# Patient Record
Sex: Female | Born: 1935 | Race: Black or African American | Hispanic: No | Smoking: Never smoker
Health system: Southern US, Community
[De-identification: ages and names within clinical notes are randomized; demographics above are authoritative.]

## PROBLEM LIST (undated history)

## (undated) DIAGNOSIS — T7840XA Allergy, unspecified, initial encounter: Secondary | ICD-10-CM

## (undated) DIAGNOSIS — G5700 Lesion of sciatic nerve, unspecified lower limb: Secondary | ICD-10-CM

## (undated) DIAGNOSIS — M21379 Foot drop, unspecified foot: Secondary | ICD-10-CM

## (undated) DIAGNOSIS — E785 Hyperlipidemia, unspecified: Secondary | ICD-10-CM

## (undated) DIAGNOSIS — I2699 Other pulmonary embolism without acute cor pulmonale: Secondary | ICD-10-CM

## (undated) DIAGNOSIS — Z5189 Encounter for other specified aftercare: Secondary | ICD-10-CM

## (undated) DIAGNOSIS — M1990 Unspecified osteoarthritis, unspecified site: Secondary | ICD-10-CM

## (undated) DIAGNOSIS — D509 Iron deficiency anemia, unspecified: Secondary | ICD-10-CM

## (undated) DIAGNOSIS — IMO0001 Reserved for inherently not codable concepts without codable children: Secondary | ICD-10-CM

## (undated) DIAGNOSIS — I10 Essential (primary) hypertension: Secondary | ICD-10-CM

## (undated) DIAGNOSIS — I70209 Unspecified atherosclerosis of native arteries of extremities, unspecified extremity: Secondary | ICD-10-CM

## (undated) HISTORY — DX: Other pulmonary embolism without acute cor pulmonale: I26.99

## (undated) HISTORY — PX: TONSILLECTOMY: SUR1361

## (undated) HISTORY — DX: Lesion of sciatic nerve, unspecified lower limb: G57.00

## (undated) HISTORY — DX: Unspecified atherosclerosis of native arteries of extremities, unspecified extremity: I70.209

## (undated) HISTORY — DX: Encounter for other specified aftercare: Z51.89

## (undated) HISTORY — PX: OTHER SURGICAL HISTORY: SHX169

## (undated) HISTORY — DX: Reserved for inherently not codable concepts without codable children: IMO0001

## (undated) HISTORY — DX: Hyperlipidemia, unspecified: E78.5

## (undated) HISTORY — DX: Essential (primary) hypertension: I10

## (undated) HISTORY — DX: Allergy, unspecified, initial encounter: T78.40XA

## (undated) HISTORY — DX: Foot drop, unspecified foot: M21.379

## (undated) HISTORY — DX: Iron deficiency anemia, unspecified: D50.9

## (undated) HISTORY — DX: Unspecified osteoarthritis, unspecified site: M19.90

---

## 1999-05-02 ENCOUNTER — Ambulatory Visit (HOSPITAL_COMMUNITY): Admission: RE | Admit: 1999-05-02 | Discharge: 1999-05-02 | Payer: Self-pay | Admitting: Sports Medicine

## 1999-06-18 ENCOUNTER — Ambulatory Visit (HOSPITAL_COMMUNITY): Admission: RE | Admit: 1999-06-18 | Discharge: 1999-06-18 | Payer: Self-pay | Admitting: Orthopedic Surgery

## 2001-01-29 ENCOUNTER — Emergency Department (HOSPITAL_COMMUNITY): Admission: EM | Admit: 2001-01-29 | Discharge: 2001-01-29 | Payer: Self-pay | Admitting: Emergency Medicine

## 2001-03-23 ENCOUNTER — Ambulatory Visit (HOSPITAL_COMMUNITY): Admission: RE | Admit: 2001-03-23 | Discharge: 2001-03-23 | Payer: Self-pay | Admitting: Orthopedic Surgery

## 2001-08-11 ENCOUNTER — Ambulatory Visit (HOSPITAL_COMMUNITY): Admission: RE | Admit: 2001-08-11 | Discharge: 2001-08-11 | Payer: Self-pay | Admitting: Internal Medicine

## 2003-02-19 DIAGNOSIS — I2699 Other pulmonary embolism without acute cor pulmonale: Secondary | ICD-10-CM

## 2003-02-19 HISTORY — PX: TOTAL HIP ARTHROPLASTY: SHX124

## 2003-02-19 HISTORY — DX: Other pulmonary embolism without acute cor pulmonale: I26.99

## 2003-05-08 ENCOUNTER — Emergency Department (HOSPITAL_COMMUNITY): Admission: EM | Admit: 2003-05-08 | Discharge: 2003-05-08 | Payer: Self-pay | Admitting: Emergency Medicine

## 2003-06-16 ENCOUNTER — Inpatient Hospital Stay (HOSPITAL_COMMUNITY): Admission: RE | Admit: 2003-06-16 | Discharge: 2003-06-24 | Payer: Self-pay | Admitting: Orthopedic Surgery

## 2003-08-20 ENCOUNTER — Inpatient Hospital Stay (HOSPITAL_COMMUNITY): Admission: EM | Admit: 2003-08-20 | Discharge: 2003-08-30 | Payer: Self-pay | Admitting: Emergency Medicine

## 2003-08-30 ENCOUNTER — Inpatient Hospital Stay
Admission: RE | Admit: 2003-08-30 | Discharge: 2003-09-09 | Payer: Self-pay | Admitting: Physical Medicine & Rehabilitation

## 2005-05-17 ENCOUNTER — Encounter: Admission: RE | Admit: 2005-05-17 | Discharge: 2005-05-17 | Payer: Self-pay | Admitting: Family Medicine

## 2007-02-04 ENCOUNTER — Encounter: Admission: RE | Admit: 2007-02-04 | Discharge: 2007-02-04 | Payer: Self-pay | Admitting: Internal Medicine

## 2007-02-19 HISTORY — PX: BREAST BIOPSY: SHX20

## 2007-04-10 ENCOUNTER — Observation Stay (HOSPITAL_COMMUNITY): Admission: RE | Admit: 2007-04-10 | Discharge: 2007-04-10 | Payer: Self-pay | Admitting: Surgery

## 2007-04-10 ENCOUNTER — Encounter (INDEPENDENT_AMBULATORY_CARE_PROVIDER_SITE_OTHER): Payer: Self-pay | Admitting: Surgery

## 2007-04-10 ENCOUNTER — Encounter: Admission: RE | Admit: 2007-04-10 | Discharge: 2007-04-10 | Payer: Self-pay | Admitting: Surgery

## 2010-01-09 ENCOUNTER — Encounter: Admission: RE | Admit: 2010-01-09 | Discharge: 2010-01-09 | Payer: Self-pay | Admitting: Orthopedic Surgery

## 2010-01-22 ENCOUNTER — Encounter: Admission: RE | Admit: 2010-01-22 | Discharge: 2010-01-22 | Payer: Self-pay | Admitting: Family Medicine

## 2010-03-11 ENCOUNTER — Encounter: Payer: Self-pay | Admitting: Family Medicine

## 2010-05-23 ENCOUNTER — Emergency Department (HOSPITAL_COMMUNITY): Payer: Medicare Other

## 2010-05-23 ENCOUNTER — Observation Stay (HOSPITAL_COMMUNITY)
Admission: EM | Admit: 2010-05-23 | Discharge: 2010-05-25 | DRG: 203 | Disposition: A | Discharge status: Home / Self Care | Payer: Medicare Other | Source: Ambulatory Visit | Attending: Internal Medicine | Admitting: Internal Medicine

## 2010-05-23 DIAGNOSIS — Z86711 Personal history of pulmonary embolism: Secondary | ICD-10-CM | POA: Insufficient documentation

## 2010-05-23 DIAGNOSIS — Z79899 Other long term (current) drug therapy: Secondary | ICD-10-CM | POA: Insufficient documentation

## 2010-05-23 DIAGNOSIS — R05 Cough: Secondary | ICD-10-CM | POA: Insufficient documentation

## 2010-05-23 DIAGNOSIS — R0989 Other specified symptoms and signs involving the circulatory and respiratory systems: Principal | ICD-10-CM | POA: Insufficient documentation

## 2010-05-23 DIAGNOSIS — I739 Peripheral vascular disease, unspecified: Secondary | ICD-10-CM | POA: Insufficient documentation

## 2010-05-23 DIAGNOSIS — R059 Cough, unspecified: Secondary | ICD-10-CM | POA: Insufficient documentation

## 2010-05-23 DIAGNOSIS — D649 Anemia, unspecified: Secondary | ICD-10-CM | POA: Insufficient documentation

## 2010-05-23 DIAGNOSIS — R0602 Shortness of breath: Secondary | ICD-10-CM | POA: Insufficient documentation

## 2010-05-23 DIAGNOSIS — R0609 Other forms of dyspnea: Principal | ICD-10-CM | POA: Insufficient documentation

## 2010-05-23 DIAGNOSIS — Z96659 Presence of unspecified artificial knee joint: Secondary | ICD-10-CM | POA: Insufficient documentation

## 2010-05-23 DIAGNOSIS — M1990 Unspecified osteoarthritis, unspecified site: Secondary | ICD-10-CM | POA: Insufficient documentation

## 2010-05-23 DIAGNOSIS — I10 Essential (primary) hypertension: Secondary | ICD-10-CM | POA: Insufficient documentation

## 2010-05-23 LAB — DIFFERENTIAL
Basophils Absolute: 0 10*3/uL (ref 0.0–0.1)
Lymphocytes Relative: 18 % (ref 12–46)
Monocytes Relative: 4 % (ref 3–12)

## 2010-05-23 LAB — POCT I-STAT, CHEM 8
HCT: 40 % (ref 36.0–46.0)
Hemoglobin: 13.6 g/dL (ref 12.0–15.0)
Potassium: 3.4 mEq/L — ABNORMAL LOW (ref 3.5–5.1)
Sodium: 140 mEq/L (ref 135–145)

## 2010-05-23 LAB — PROTIME-INR: Prothrombin Time: 13.5 seconds (ref 11.6–15.2)

## 2010-05-23 LAB — POCT CARDIAC MARKERS
Myoglobin, poc: 154 ng/mL (ref 12–200)
Troponin i, poc: <0.05 ng/mL (ref 0.00–0.09)

## 2010-05-23 LAB — CBC
Hemoglobin: 11.3 g/dL — ABNORMAL LOW (ref 12.0–15.0)
MCHC: 33.1 g/dL (ref 30.0–36.0)
RBC: 4.85 MIL/uL (ref 3.87–5.11)

## 2010-05-24 DIAGNOSIS — I369 Nonrheumatic tricuspid valve disorder, unspecified: Secondary | ICD-10-CM

## 2010-05-24 LAB — CARDIAC PANEL(CRET KIN+CKTOT+MB+TROPI)
CK, MB: 3.1 ng/mL (ref 0.3–4.0)
Relative Index: 1.9 (ref 0.0–2.5)
Relative Index: 2.2 (ref 0.0–2.5)
Total CK: 173 U/L (ref 7–177)
Troponin I: 0.04 ng/mL (ref 0.00–0.06)
Troponin I: 0.02 ng/mL (ref 0.00–0.06)

## 2010-05-24 LAB — TROPONIN I: Troponin I: 0.03 ng/mL (ref 0.00–0.06)

## 2010-05-24 LAB — D-DIMER, QUANTITATIVE: D-Dimer, Quant: 6.61 ug/mL-FEU — ABNORMAL HIGH (ref 0.00–0.48)

## 2010-05-24 LAB — IRON AND TIBC
Iron: 54 ug/dL (ref 42–135)
Saturation Ratios: 20 % (ref 20–55)
TIBC: 266 ug/dL (ref 250–470)
UIBC: 212 ug/dL

## 2010-05-24 LAB — FERRITIN: Ferritin: 295 ng/mL — ABNORMAL HIGH (ref 10–291)

## 2010-05-24 MED ORDER — IOHEXOL 300 MG/ML  SOLN
100.0000 mL | Freq: Once | INTRAMUSCULAR | Status: AC | PRN
  Administered 2010-05-24: 100 mL via INTRAVENOUS

## 2010-05-25 DIAGNOSIS — R0602 Shortness of breath: Secondary | ICD-10-CM

## 2010-05-27 NOTE — H&P (Signed)
NAME:  Joan Waters, Joan Waters NO.:  [id number]  MEDICAL RECORD NO.:  [id number]           PATIENT TYPE:  I  LOCATION:  4706                         FACILITY:  [facility]  PHYSICIAN:  Geoffry Paradise, M.D.  DATE OF BIRTH:  1928/01/19  DATE OF ADMISSION:  05/23/2010 DATE OF DISCHARGE:                             HISTORY & PHYSICAL   CHIEF COMPLAINT:  Dyspnea.  HISTORY OF PRESENT ILLNESS:  Joan Waters is an 77 year old patient of Dr. Jarome Matin who presents with unexplained dyspnea.  She does have a history of essential hypertension and has not been hospitalized since her right total hip replacement in 2005 complicated by multiple pulmonary emboli and a course of inpatient rehab.  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  She normally is very active and does live with her husband who has been recentlyhospitalized and transferred to [facility] for rehab following amputation.  She evidently has had some episodic dyspnea going on for approximately 1 week culminating in severe symptoms last evening to the extent that she presented to urgent medical care for treatment.  She did not contact Dr. Silvano Rusk office and was treated at urgent medical care with bronchodilators and steroids.  She evidently settled down to some extent but represented to the emergency room tonight with escalating dyspnea for further management.  She has had no chest pain nor has she had any cough or sputum production.  She had no prior diagnosis of asthma and she has no cardiac history.  She has had no orthopnea or lower extremity swelling.  She denies any chest pain, sputum or fevers. She is currently back to her baseline following some bronchodilators in the emergency room and we have been called to admit her for unexplained dyspnea, possible COPD having failed outpatient interventions.  PAST  MEDICAL HISTORY:  ALLERGIES:  No known drug allergies.  MEDICATIONS:  Labetalol 200 mg p.o. b.i.d.  MEDICAL ILLNESSES:  Essential hypertension, osteoarthritis, pulmonary emboli in 2005, peripheral vascular disease by history and a sciatic nerve palsy secondary to stretch injury resulting a right lower extremity foot drop and sensory changes.  Surgical illnesses include right total hip replacement in 2005, remote tonsillectomy, breast biopsy in 2009.  FAMILY HISTORY:  Noncontributory.  SOCIAL HISTORY:  The patient is married, is currently alone, is fairly independent in her household.  She has never smoked and currently does not smoke by her report.  PHYSICAL EXAMINATION:  VITAL SIGNS:  Temperature is not recorded on the chart, blood pressure 134/76, pulse 79, respiratory rate currently is 16 unlabored, O2 saturation is 100% 2 L. GENERAL:  She is supine, no distress.  No dyspnea, calm, interactive and appropriate. HEENT:  Murky sclerae, anicteric.  Fundi nonvisualized.  Extraocular movements intact.  Visual fields intact.  Oropharynx benign. NECK:  Supple.  No JVD or bruits. RESPIRATORY:  No accessory muscle utilization.  Lungs are clear to auscultation and percussion.  Normal inspiratory, expiratory phases.  No wheezing, rales or rhonchi. CARDIOVASCULAR:  Regular rate and rhythm, a 2/6 systolic  ejection murmur over the aortic outflow tract.  No S3 or S4.  No rubs. ABDOMEN:  Soft, nontender.  Bowel sounds normal.  No hepatosplenomegaly. BACK:  No CVA or spinal tenderness. EXTREMITIES:  Some venous stasis changes.  Trace edema.  Calves soft and nontender.  Negative Homans sign.  She does have a right lower extremity foot drop with brace intact. NEUROLOGIC:  She is nonlateralized.  Chest x-ray, no acute disease.  EKG reveals a bundle branch block with ST changes.  CBC; hemoglobin 11.3, hematocrit 34.1, white blood count is 5.7, platelet count was 252,000.  Chemistry; sodium  140, potassium 3.4, chloride 103, BUN 20, creatinine 1, glucose 111, CK-MB is 2.7, troponin less than 0.05.  Chest x-ray, no acute cardiopulmonary disease, interstitial coarsening chronic  ASSESSMENT: 1. Dyspnea, etiology not clear.  We will order CT angio, rule out PE,     given her prior history; we will treat empirically for reactive     airway disease/OPD with bronchodilators and steroids and check an     echocardiogram given the cardiac murmur not knowing longevity.  She     certainly is not evaluated in the recent past and claims she has     not seen Dr. Eloise Harman recently as well.  There is no evidence that     this is any type of anginal equivalent or coronary syndrome at this     time by history or exam. 2. Essential hypertension, degree of baseline control not clear,     currently well controlled. 3. History of hyperlipidemia.  PLAN:  As above, empiric treat with bronchodilators and steroids.  CT angio pending and decision regarding anticoagulation based upon this. Echocardiogram ordered as well.  For further see orders.          ______________________________ Geoffry Paradise, M.D.     RA/MEDQ  D:  05/24/2010  T:  05/24/2010  Job:  [id number]  Electronically Signed by Geoffry Paradise M.D. on 05/27/2010 11:40:42 AM

## 2010-06-06 NOTE — H&P (Signed)
NAME:  Joan Waters, Joan Waters NO.:  [id number]  MEDICAL RECORD NO.:  [id number]           PATIENT TYPE:  I  LOCATION:  4706                         FACILITY:  [facility]  PHYSICIAN:  Barry Dienes. Eloise Harman, M.D.DATE OF BIRTH:  1927/06/18  DATE OF ADMISSION:  05/23/2010 DATE OF DISCHARGE:  05/25/2010                             HISTORY & PHYSICAL   PERTINENT FINDINGS:  The patient is an 77 year old woman who presented to the emergency room with unexplained dyspnea.  She has a history of essential hypertension and in 2005 had right total hip replacement complicated by multiple pulmonary emboli.  She also has a right foot drop because of sciatic nerve palsy for which she wears an ankle-foot orthosis.  For about the past week, she has had dyspnea with exertion and was seen at an urgent care clinic and treated with bronchodilators and short course of steroids.  Her symptoms improved initially, then worsened.  She has not had substernal chest pain or productive cough. She has not had orthopnea or lower extremity edema.  She felt better in the emergency room after initial treatment with bronchodilators and corticosteroids.  PAST MEDICAL HISTORY:  Hypertension, osteoarthritis with chronic left hip pain.  In 2005, pulmonary emboli treated with Coumadin anticoagulation, atherosclerotic peripheral vascular disease, right- sided sciatic neuropathy causing right-sided foot drop.  PAST SURGICAL HISTORY:  In 2005, right total hip replacement, remote tonsillectomy.  In 2009, breast biopsy that was benign.  SOCIAL HISTORY:  She is married and currently her husband lives in a skilled nursing facility because of end-stage osteoarthritis.  She has no history of tobacco or alcohol abuse.  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  INITIAL PHYSICAL EXAMINATION:  VITAL SIGNS:  Blood pressure 134/76, pulse 79, respirations 16, pulse oxygen saturation was 100% on 2 L  per minute of nasal cannula oxygen. GENERAL:  She is a well-nourished, well-developed black woman who was in no apparent distress. HEAD, EYES, EARS, NOSE AND THROAT:  Within normal limits. NECK:  Supple without jugular venous distention or carotid bruit. CHEST:  Clear to auscultation with no wheezing or rhonchi. HEART:  Had a regular rate and rhythm with a grade 2/6 systolic ejection murmur and there was no S3 or rub. ABDOMEN:  Benign. EXTREMITIES:  She had bilateral trace leg edema without a palpable venous cord and she has a right lower extremity foot drop.  INITIAL LABORATORY STUDIES:  EKG showed the following:  Normal sinus rhythm, first-degree AV block, nonspecific intraventricular conduction delay, lateral infarct, age indeterminate, abnormal R-wave progression. CBC had white blood cell count 5.7, hemoglobin 11.3, platelets 252. Serum sodium was 140, potassium 3.4, chloride 103, BUN 20, creatinine 1.0, glucose 111, CPK-MB was 2.7.  Troponin I was less than 0.05.  Chest x-ray showed no acute cardiopulmonary disease with chronic interstitial coarsening.  HOSPITAL COURSE:  The patient was admitted to a medical bed with telemetry.  She had serial cardiac isoenzymes drawn that were within normal limits.  She also had a CT angiography of the chest with IV contrast done that showed no abnormal filling defects within the main pulmonary arteries  or its branches to suggest a pulmonary embolus. There was respiratory motion artifact that obscured the lower lobe pulmonary arteries.  There was a small nodule on the right middle lobe measuring 4.6 mm.  There was no airspace consolidation identified. There is a focal area of bronchial occlusion within the right lower lobe that likely represented a mucus plug.  Limited imaging to the upper abdomen showed a large cyst arising from the upper pole of the left kidney that measured at least 9.4 cm and a small cyst was seen in the upper pole of the  right kidney.  It was recommended that she have a reevaluation of the right middle lobe nodule with a followup CT scan in 6-12 months.  She also had a left lower extremity DVT ultrasound exam that showed no obvious evidence of DVT, superficial thrombus, or Baker cyst.  She had an echocardiogram done to evaluate her heart murmur. The echocardiogram showed left ventricular ejection fraction of 55-60% with a normal size left ventricular cavity.  The wall thickness was increased in the pattern of moderate left ventricular hypertrophy.  There was abnormal septal wall motion.  The aortic valve leaflets were mildly calcified and there was no evidence of significant aortic valve stenosis.  There was trivial mitral regurgitation and mild tricuspid regurgitation.  The right ventricular systolic function was normal.  She also had laboratory studies that also included a serum iron of 54 with total iron binding capacity of 266 (20% saturation) and serum ferritin 295.  A D-dimer level was 6.41.  CONDITION ON DISCHARGE:  Her breathing was at her baseline.  She was eating and drinking well and not having any dyspnea with ambulating in the room.  Most recent vital signs include blood pressure 176/69, pulse 72, respirations 20, temperature 98.1, pulse oxygen saturation was 96% on room air.  In general, she is an elderly black woman who is in no apparent distress.  Chest as clear to auscultation.  Heart had a regular rate and rhythm with a systolic ejection murmur grade 1/6.  Abdomen was benign.  Extremities were without edema.  Most recent laboratory studies include the following:  White blood cell count 5.7, hemoglobin 11.3, hematocrit 34.1, platelets 252 with MCV 70 and RDW 14.8, serum sodium 140, potassium 3.4, BUN 20, creatinine 1.0.  DISCHARGE DIAGNOSES: 1. Dyspnea, possibly secondary to asthma given mucous plug seen on     chest CT scan. 2. Hypertension, essential. 3. Left ventricular  hypertrophy. 4. Nonspecific intraventricular conduction delay. 5. Microcytic anemia. 6. Hypokalemia. 7. Elevated D-dimer test without evidence of pulmonary embolism or     DVT. 8. Hyperlipidemia.  DISCHARGE MEDICATIONS: 1. Guaifenesin 600 mg p.o. twice daily. 2. Prednisone 10 mg to be taken as 4 tablets daily for 2 days, then 3     tablets daily for 2 days, then 2 tablets daily for 3 days, then 1     tablet daily for 5 days. 3. Proventil HFA take 2 inhalations by mouth 4 times daily as needed     for difficulty breathing or wheezing. 4. Potassium chloride 20 mEq p.o. daily. 5. Iron sulfate 325 mg p.o. daily. 6. Labetalol 200 mg p.o. twice daily. 7. Lovastatin 40 mg p.o. daily. 8. Maxzide 25 1 tablet p.o. daily. 9. Norvasc 5 mg p.o. daily.  DISPOSITION AND FOLLOWUP:  The patient should call [facility] at [phone] to schedule a followup evaluation within 1-2 weeks following discharge.  She should call our office as soon as possible  should her symptoms worsen in the interim.  The process of discharge took 40 minutes.          ______________________________ Barry Dienes Eloise Harman, M.D.     DGP/MEDQ  D:  05/25/2010  T:  05/26/2010  Job:  [id number]  Electronically Signed by Jarome Matin M.D. on 06/06/2010 08:05:34 AM

## 2010-06-14 ENCOUNTER — Ambulatory Visit (INDEPENDENT_AMBULATORY_CARE_PROVIDER_SITE_OTHER): Payer: Medicare Other | Admitting: Gastroenterology

## 2010-06-14 ENCOUNTER — Encounter: Payer: Self-pay | Admitting: Gastroenterology

## 2010-06-14 VITALS — BP 120/62 | HR 84 | Ht 65.0 in | Wt 178.0 lb

## 2010-06-14 DIAGNOSIS — D509 Iron deficiency anemia, unspecified: Secondary | ICD-10-CM

## 2010-06-14 MED ORDER — PEG-KCL-NACL-NASULF-NA ASC-C 100 G PO SOLR: 1.0000 | Freq: Once | ORAL | Status: AC

## 2010-06-14 NOTE — Progress Notes (Signed)
History of Present Illness: This is an 77 year old female with a history of recurrent iron deficiency anemia. She states she has been treated with iron several times in the past. She was recently hospitalized for dyspnea felt secondary to bronchospasm. I have reviewed the hospital discharge summary, bloodwork and imaging study reports from that hospitalization. No significant cardiopulmonary disease was uncovered. Her hemoglobin was 11.3 with an MCV of 70 and an iron saturation of 20%. She has no gastrointestinal complaints and denies change in bowel habits, constipation, diarrhea, change in stool caliber, melena, hematochezia, weight loss, nausea, vomiting, hematemesis, dysphagia.  She is not previously had colonoscopy or upper endoscopy. Past Medical History  Diagnosis Date  . Hypertension   . OA (osteoarthritis)   . Pulmonary embolism 2005  . Atherosclerotic peripheral vascular disease   . Sciatic neuropathy     right sided causing foot drop  . Hyperlipidemia   . Iron deficiency anemia    Past Surgical History  Procedure Date  . Total hip arthroplasty 2005    Right  . Tonsillectomy   . Breast biopsy 2009  . Hysterotomy     reports that she has never smoked. She has never used smokeless tobacco. She reports that she does not drink alcohol or use illicit drugs. family history includes COPD in her mother and Stroke in her father. No Known Allergies Outpatient Encounter Prescriptions as of 06/14/2010  Medication Sig Dispense Refill  . albuterol (PROVENTIL HFA) 108 (90 BASE) MCG/ACT inhaler Inhale 2 puffs into the lungs 4 (four) times daily as needed.        Marland Kitchen amLODipine (NORVASC) 5 MG tablet Take 5 mg by mouth daily.        . ferrous sulfate 325 (65 FE) MG tablet Take 325 mg by mouth daily with breakfast.        . labetalol (NORMODYNE) 200 MG tablet Take 200 mg by mouth 2 (two) times daily.        Marland Kitchen lovastatin (MEVACOR) 40 MG tablet Take 40 mg by mouth at bedtime.        . potassium  chloride SA (K-DUR,KLOR-CON) 20 MEQ tablet Take 20 mEq by mouth daily.        Marland Kitchen triamterene-hydrochlorothiazide (MAXZIDE-25) 37.5-25 MG per tablet Take 1 tablet by mouth daily.        . peg 3350 powder (MOVIPREP) 100 G SOLR Take 1 kit (100 g total) by mouth once.  1 kit  0   Review of Systems: Joints and is joint pain severe left hip pain. Pertinent positive and negative review of systems were noted in the above HPI section. All other review of systems were otherwise negative.  Physical Exam: General: Well developed , well nourished, no acute distress Head: Normocephalic and atraumatic Eyes:  sclerae anicteric, EOMI Ears: Normal auditory acuity Mouth: No deformity or lesions Neck: Supple, no masses or thyromegaly Lungs: Clear throughout to auscultation Heart: Regular rate and rhythm; no murmurs, rubs or bruits Abdomen: Soft, non tender and non distended. No masses, hepatosplenomegaly or hernias noted. Normal Bowel sounds Rectal: Deferred to colonoscopy  Musculoskeletal: Using a walker with severe gait difficulties due to osteoarthritis and severe chronic left hip pain  Skin: No lesions on visible extremities Pulses:  Normal pulses noted Extremities: No clubbing, cyanosis, edema or deformities noted Neurological: Alert oriented x 4, grossly nonfocal Cervical Nodes:  No significant cervical adenopathy Inguinal Nodes: No significant inguinal adenopathy Psychological:  Alert and cooperative. Normal mood and affect  Assessment and Recommendations:  1. Recurrent iron deficiency anemia.  Rule out sources of gastrointestinal blood loss such as AVMs, neoplasms and ulcers. Obtain stool Hemoccults. Continue fe repoacement. The risks, benefits, and alternatives to colonoscopy with possible biopsy and possible polypectomy were discussed with the patient and they consent to proceed. The risks, benefits, and alternatives to endoscopy with possible biopsy and possible dilation were discussed with the  patient and they consent to proceed.   2. Severe osteoarthritis in left hip. She has an orthopedic appointment with Dr. Charlann Boxer on May 26th.

## 2010-06-14 NOTE — Patient Instructions (Signed)
You have been scheduled for a Upper Endoscopy and Colonoscopy, separate instruction sheet given. Follow instructions on Hemoccult cards and mail back to [place] when finished.  Cc: Jarome Matin, MD

## 2010-06-26 ENCOUNTER — Other Ambulatory Visit: Payer: Self-pay | Admitting: Gastroenterology

## 2010-06-26 ENCOUNTER — Ambulatory Visit (AMBULATORY_SURGERY_CENTER): Payer: Medicare Other | Admitting: Gastroenterology

## 2010-06-26 ENCOUNTER — Encounter: Payer: Self-pay | Admitting: Gastroenterology

## 2010-06-26 ENCOUNTER — Other Ambulatory Visit: Payer: Medicare Other

## 2010-06-26 VITALS — BP 129/50 | HR 83 | Temp 97.5°F | Resp 13 | Ht 65.0 in | Wt 172.0 lb

## 2010-06-26 DIAGNOSIS — Z1289 Encounter for screening for malignant neoplasm of other sites: Secondary | ICD-10-CM

## 2010-06-26 DIAGNOSIS — K2940 Chronic atrophic gastritis without bleeding: Secondary | ICD-10-CM

## 2010-06-26 DIAGNOSIS — K2970 Gastritis, unspecified, without bleeding: Secondary | ICD-10-CM

## 2010-06-26 DIAGNOSIS — K5730 Diverticulosis of large intestine without perforation or abscess without bleeding: Secondary | ICD-10-CM

## 2010-06-26 DIAGNOSIS — D649 Anemia, unspecified: Secondary | ICD-10-CM

## 2010-06-26 DIAGNOSIS — K6389 Other specified diseases of intestine: Secondary | ICD-10-CM

## 2010-06-26 DIAGNOSIS — D509 Iron deficiency anemia, unspecified: Secondary | ICD-10-CM

## 2010-06-26 LAB — HEMOCCULT SLIDES (X 3 CARDS)
Fecal Occult Blood: NEGATIVE
OCCULT 1: NEGATIVE
OCCULT 2: NEGATIVE
OCCULT 3: NEGATIVE
OCCULT 4: NEGATIVE
OCCULT 5: NEGATIVE

## 2010-06-26 MED ORDER — SODIUM CHLORIDE 0.9 % IV SOLN: 500.0000 mL | INTRAVENOUS | Status: AC

## 2010-06-26 NOTE — Patient Instructions (Signed)
Refer to blue and green discharge sheets.

## 2010-06-27 ENCOUNTER — Telehealth: Payer: Self-pay | Admitting: *Deleted

## 2010-06-27 NOTE — Telephone Encounter (Signed)
No answer at number and no message left due to no identification on machine

## 2010-07-02 ENCOUNTER — Encounter: Payer: Self-pay | Admitting: Gastroenterology

## 2010-07-03 NOTE — Op Note (Signed)
NAME:  PHEONIX, WISBY NO.:  [id number]   MEDICAL RECORD NO.:  [id number]          PATIENT TYPE:  OBV   LOCATION:  2899                         FACILITY:  [facility]   PHYSICIAN:  Clovis Pu. Cornett, M.D.DATE OF BIRTH:  August 18, 1927   DATE OF PROCEDURE:  04/10/2007  DATE OF DISCHARGE:                               OPERATIVE REPORT   PREOPERATIVE DIAGNOSIS:  Left breast mass.   POSTOPERATIVE DIAGNOSIS:  Left breast mass.   PROCEDURE:  Left breast needle localized excisional biopsy.   SURGEON:  Maisie Fus A. Cornett, M.D.   ANESTHESIA:  MAC with 0.25% Sensorcaine local.   ESTIMATED BLOOD LOSS:  20 mL.   SPECIMEN:  Left breast tissue with clip verified by radiographs to be  adequate.   DRAINS:  None.   INDICATIONS FOR PROCEDURE:  The patient is a 77 year old female with a  sclerosing left breast found by core biopsy.  Excisional biopsy is  recommended for further evaluation.  She presents today for that.   DESCRIPTION OF PROCEDURE:  After undergoing left breast needle  localization, the patient was brought to the operating suite.  After  induction of MAC anesthesia, the left breast was prepped and draped in a  sterile fashion.  The wire was trimmed.  A curvilinear incision was made  at about 12 o'clock where the wire entered the breast.  Tissue flaps  were raised slightly.  Unfortunately, during manipulation of the mass,  the localizing wire came out.  The entire wire, though, was removed in  its entirety upon examination.  Fortunately, I could feel the mass and  was able to excise it without the wire.  Specimen radiograph revealed  the biopsy clip to be within the mass.  Irrigation was used and  hemostasis was achieved.  The wound was closed in layers with a deep  layer of 3-0 Vicryl and a 4-0 Monocryl subcuticular stitch.  Dermabond  was applied to the skin.  All final counts of sponge, needle and  instruments were found to be correct this portion of the case.   The  patient was then awakened and taken to recovery in satisfactory  conditions.  All final counts were found to be correct x2.      Thomas A. Cornett, M.D.  Electronically Signed    TAC/MEDQ  D:  04/10/2007  T:  04/11/2007  Job:  [id number]   cc:   Barry Dienes. Eloise Harman, M.D.

## 2010-07-06 NOTE — Discharge Summary (Signed)
NAME:  Joan Waters, Joan Waters                      ACCOUNT NO.:  [id number]   MEDICAL RECORD NO.:  [id number]                   PATIENT TYPE:  INP   LOCATION:  5019                                 FACILITY:  [facility]   PHYSICIAN:  Myrtie Neither, M.D.                 DATE OF BIRTH:  04/17/1927   DATE OF ADMISSION:  06/16/2003  DATE OF DISCHARGE:  06/24/2003                                 DISCHARGE SUMMARY   ADMISSION DIAGNOSIS:  Severe degenerative joint disease right hip.   DISCHARGE DIAGNOSES:  1. Severe degenerative joint disease right hip.  2. Sciatic nerve palsy, stretch injury.   INFECTIONS:  None.   PAST SURGICAL HISTORY:  Right total hip arthroplasty done on June 16, 2003.   HISTORY OF PRESENT ILLNESS:  This is a 77 year old female who has been  followed for severe degenerative joint disease involving her right hip.  The  patient had been treated with anti-inflammatories, pain medications, and use  of cane for ambulation with progressive worsening of pain on rest as well as  ambulation.   PHYSICAL EXAMINATION:  EXTREMITIES:  Pertinent physical was that of the  right hip.  The patient had an antalgic gait on the right side.  Right hip  had limited range of motion on rotation with severe pain on rotation as well  as attempted flexion.  NEUROLOGICAL:  Intact.   LABORATORY DATA:  X-rays in the office demonstrated a sclerotic degenerative  joint changes involving the femoral head and acetabulum.   HOSPITAL COURSE:  The patient had preoperative medical evaluation by primary  care physician and was found to be stable enough to undergo surgery.  The  patient underwent CBC, CMET, EKG, chest x-ray, urinalysis, PT, and PTT.  The  patient's laboratory was stable enough for the patient to undergo surgery.   The patient underwent right total hip arthroplasty on June 16, 2003.  Postoperatively, the patient was found to have stretch nerve injury with  drop foot on the right side.  The  patient was fitted for a drop foot brace  orthotic and was continued on physical therapy and progressed fairly well  with it.   The patient was partial weightbearing on the right side with Coumadin  prophylaxis.  The patient progressed well enough to be discharged to home  with home health therapy arranged.   DISCHARGE MEDICATIONS:  1. The patient was continued on Coumadin therapy.  2. Percocet 1-2 p.o. q.4h. p.r.n. pain.   CONDITION ON DISCHARGE:  The patient was discharged in stable and  satisfactory condition.                                                Myrtie Neither, M.D.    AC/MEDQ  D:  08/02/2003  T:  08/03/2003  Job:  [id number] 

## 2010-07-06 NOTE — Op Note (Signed)
NAME:  CATHLEEN, YAGI                      ACCOUNT NO.:  [id number]   MEDICAL RECORD NO.:  [id number]                   PATIENT TYPE:  INP   LOCATION:  2550                                 FACILITY:  [facility]   PHYSICIAN:  Myrtie Neither, M.D.                 DATE OF BIRTH:  [birth year]   DATE OF PROCEDURE:  06/16/2003  DATE OF DISCHARGE:                                 OPERATIVE REPORT   PREOPERATIVE DIAGNOSIS:  Severe degenerative joint disease, right hip.   POSTOPERATIVE DIAGNOSIS:  Severe degenerative joint disease, right hip.   ANESTHESIA:  General.   PROCEDURE:  Right total hip arthroplasty.   PROCEDURE IN DETAIL:  The patient was taken to the operating room after  given adequate preoperative medications, given general anesthesia and  intubated. The patient was placed in the right lateral position. Right hip  was prepped with DuraPrep and draped in a sterile manner.   A posterior saddle __________  made over the right hip, going through the  skin and subcutaneous tissue down to the fascial lata. Sharp rotators were  identified and released. Capsules were identified and incised, partially  resected. The hip was then dislocated, severe degenerative joint changes  were noted over the head as well as neck and acetabulum. Femoral neck  cutting guide was put in place and appropriate cut was made. Next, exposure  of the acetabulum was done with resection of the capsule. Reaming of the  acetabulum was done and started out with 48 mm and reaming of the acetabulum  was done all the way up to 51 mm. Good bleeding surface was obtained.  Osteophytes were removed from the area. Trial implant was put in place and  was found to be a very good snug fit.   Next, attention was turned to the femoral neck. Cookie cutter was used to  initiate cutting into the canal. Canal finder was then used. Reaming of the  femoral canal was done up to 13.5 mm with good bony surface of the femoral  canal.  Marney Doctor was put in place and trial reduction of the hip was done. Hip  was too tight. Further reaming down the femoral canal was done, also with  removal of more of the femoral neck. This allowed good reduction of the hip,  full range of motion, good stability, no telescoping, good leg length.  Permanent components were then put in place using three-hole acetabular  liner, 52 mm, for a 32 mm head, acetabular cup 10-degree, 32 mm +1 femoral  head, Press-Fit, small statue from the stem, 13.5 mm. Two cancellous screws  were placed into the acetabulum for further stability. Hole eliminator was  used with all components put in place. Again full range of motion, good  stability, no telescoping and good leg length. Copious irrigation was then  done followed by closure of the fascia with 0 Vicryl, 2-0 for the  subcutaneous and skin staples  for the skin. Compressive dressing was  applied. The patient was placed in the hip abduction pillow.   The patient tolerated the procedure quite well and went to the recovery room  in stable and satisfactory condition.                                               Myrtie Neither, M.D.    AC/MEDQ  D:  06/16/2003  T:  06/16/2003  Job:  [id number]

## 2010-07-06 NOTE — H&P (Signed)
NAME:  Joan Waters, Joan Waters                      ACCOUNT NO.:  [id number]   MEDICAL RECORD NO.:  [id number]                   PATIENT TYPE:  INP   LOCATION:  2011                                 FACILITY:  [facility]   PHYSICIAN:  Mark A. Perini, M.D.                DATE OF BIRTH:  [birth year]   DATE OF ADMISSION:  08/20/2003  DATE OF DISCHARGE:                                HISTORY & PHYSICAL   CHIEF COMPLAINT:  Shortness of breath.   HISTORY OF PRESENT ILLNESS:  Ms. Purdum is a 77 year old female who had a  recent right hip replacement.  This has been complicated by sciatic nerve  palsy due to a stretch injury and some persistent edema of her right lower  extremity.  She did take postoperative Coumadin up until about one month  ago.  She has been walking with a walker.  She felt somewhat poorly earlier  this week and has not been moving around quite as much.  She presented to  the ER today with three day history of shortness of breath, particularly  worse over the last day.  In the emergency room, CT scan was performed and  showed extensive bilateral pulmonary emboli.  She will require admission.   PAST MEDICAL HISTORY:  1. Congestive heart failure is listed in the chart, but the patient denies     this.  2. Hypertension.  3. April 2005, she had a right hip replacement.  4. Degenerative joint disease.  5. 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     She has numbness of her right foot and weakness of her right foot.  6. Atherosclerotic peripheral vascular disease is also listed in the chart,     but she cannot corroborate this.  7. She has no history of coronary disease or stroke, per her history.  8. She has a history of tonsillectomy, but no other surgical history.   ALLERGIES:  None.   MEDICATIONS:  1. Lasix 40 mg daily.  2. Labetalol 200 mg twice a day.  3. She has been off of Coumadin for four weeks.   SOCIAL HISTORY:  No alcohol, no  tobacco, no drug use.  She is married to her  husband, Ivar Drape, since 54.  He is at her bedside.  She has one daughter,  three grandchildren and two great-grandchildren.   FAMILY HISTORY:  Noncontributory.   REVIEW OF SYSTEMS:  She has three days of shortness of breath, worse at  night.  She has been having sweats every night and possibly some subjective  temperatures.  She has had no new swelling, but her right lower extremity  has stayed swollen since her surgery.  She denies any chest pain.  She has  had some cough, especially the last three or four days, but this is not  productive and there is no hematemesis or hemoptysis.  She has felt bad all  week.  She says that her bowel movements have been normal.  She has had no  blood from above or below.  Normal genitourinary function.  She has maybe  had a little bit of weight loss recently.  She does not get regular  mammograms, but she does have Dr. Eloise Harman check her periodically.  She has  still been doing physical therapy at home.   PHYSICAL EXAMINATION:  VITAL SIGNS:  94% saturation on room air; blood  pressure 128/42, although her systolic pressure was as low as the 80s on  admission; pulse 81; respiratory rate 33; weight 186 pounds; temperature was  97 on admission and then went up to 101.  GENERAL:  She is in no acute distress.  She is in good spirits.  LUNGS:  Clear to auscultation bilaterally with no wheezes, rales or rhonchi.  HEART:  Regular rate and rhythm with no murmur.  ABDOMEN:  Soft.  EXTREMITIES:  There is bilateral lower extremity edema, right greater than  left; the right is 2+ and the left is 1+.  Her right leg is somewhat warm to  touch.  There is no definite erythema.  NEUROLOGIC:  She is intact.  BREASTS:  Bilateral clinical breast exam is done and is normal.   LABORATORY DATA:  Chest x-ray personally reviewed and showed some left base  atelectasis and a small effusion; otherwise normal.  CT scan of the  chest  personally reviewed and showed extensive bilateral pulmonary emboli, some  left base atelectasis and a small effusion, pleural effusion, at the left  base.  She also has a left renal cyst approximately 7.5 cm in maximum  dimension that appears to be a simple cyst.  EKG shows normal sinus rhythm  and premature atrial contractions, left anterior fascicular block and left  axis deviation, biatrial enlargement, but no change to her April 2005 EKG.  There is some nonspecific ST and T wave flattening inferiorly, but this is  unchanged compared to two months ago.  Sodium is 139, potassium 3.5,  chloride 105, CO2 23, BUN 12, creatinine 1.0, glucose 117, pH 7.43, PCO2 34,  bicarb 22.4.  D-Dimer is elevated at 12.9.  Blood cultures times two have  been sent.  CK MB is 1.8, troponin I is somewhat elevated at 0.5.  The first  set of CK MB was 3.0 and troponin I was 0.6.  Myoglobin has been 127 and  108.  White count is 6.6 with 76% segs, 14% lymphocytes, 7% monocytes.  Hemoglobin is somewhat low at 9.5 with an MCV of 71.  Platelet count is  341,000.  UA is negative, except for moderate leukocytes.  The microscopic  shows 3:6 white cells, a few squamous cells and a few bacteria.   ASSESSMENT AND PLAN:  This is a 77 year old female with bilateral pulmonary  emboli.  We will admit.  We will treat with Lovenox therapy.  We will  initiate Coumadin therapy.  She will be on bed rest for the first 24 to 48  hours.  We will continue labetalol.  We will place her on a cardiac diet.  We will decrease her Lasix to 20 mg daily and add potassium.  We will  hydrate gently with 50 cc an hour of normal saline.  We will add a proton  pump inhibitor for GI protection.  We will heme check stools and check iron  studies given her apparent iron deficiency anemia.  The patient is a full  code status and  her prognosis is guarded.                                               Mark A. Waynard Edwards, M.D.    MAP/MEDQ  D:   08/20/2003  T:  08/22/2003  Job:  [id number]   cc:   Barry Dienes. Eloise Harman, M.D.  [address]  [place]  [place] [address]  Fax: [phone]   Montez Morita, Dr.

## 2010-07-06 NOTE — Consult Note (Signed)
NAME:  Joan Waters, Joan Waters                      ACCOUNT NO.:  [id number]   MEDICAL RECORD NO.:  [id number]                   PATIENT TYPE:  INP   LOCATION:  2011                                 FACILITY:  [facility]   PHYSICIAN:  John C. Madilyn Fireman, M.D.                 DATE OF BIRTH:  Jun 18, 1927   DATE OF CONSULTATION:  08/22/2003  DATE OF DISCHARGE:                                   CONSULTATION   HISTORY OF PRESENT ILLNESS:  The patient is a 78 year old white female who  presents with extensive bilateral  pulmonary emboli after a recent right hip  replacement.  She has also been found to be anemic with hemoglobin of 8.8,  MCV 71, and iron saturation of 75.  She states she has had iron deficiency  in the past that was corrected by oral iron.  She denies any history of GI  bleeding, any blood in her stool, any GI symptoms whatsoever, or any family  history of GI malignancy.  She has never had peptic ulcer disease, and she  has never had any prior colon screening.  She had one stool this admission  which was heme negative.  She was started on Lovenox with Coumadin last  night and has been given supplemental oxygen.  Her respiratory status is  improved today compared to yesterday.   PAST MEDICAL HISTORY:  1. History of congestive heart failure.  2. Hypertension.  3. Degenerative joint disease.  4. Atherosclerotic peripheral vascular disease.   PAST SURGICAL HISTORY:  Tonsillectomy and right hip replacement.   ALLERGIES:  None.   MEDICATIONS:  Lasix, labetalol.   FAMILY HISTORY:  Negative for GI malignancy.   SOCIAL HISTORY:  The patient denies alcohol or tobacco use.  She is married.   PHYSICAL EXAMINATION:  GENERAL:  Well-developed, well-nourished white female  in no acute distress.  HEART:  Regular rate and rhythm without murmur.  LUNGS: Clear.  ABDOMEN: Soft, nondistended.  Normoactive bowel sounds.  No  hepatosplenomegaly, mass, or guarding.   IMPRESSION:  1. Iron-deficiency  anemia with one heme-negative stool.  No gastrointestinal     symptoms.  No risk factors for gastrointestinal malignancy.  2. Acute pulmonary embolism requiring anticoagulation.   PLAN:  Given recent respiratory compromise and concerns about sedation as  well as her heme-negative stool portending a relatively low risk of colonic  neoplasia and GI bleeding responsible for her anemia, will limit initial  workup to barium enema while remaining on current anticoagulation.  Further  recommendations to follow.                                               John C. Madilyn Fireman, M.D.    JCH/MEDQ  D:  08/22/2003  T:  08/22/2003  Job:  [id number]   cc:  Mark A. Waynard Edwards, M.D.  [address]  [place]  [place] [address]  Fax: [phone]

## 2010-07-06 NOTE — Discharge Summary (Signed)
NAME:  Joan Waters, Joan Waters                      ACCOUNT NO.:  [id number]   MEDICAL RECORD NO.:  [id number]                   PATIENT TYPE:  INP   LOCATION:  2011                                 FACILITY:  [facility]   PHYSICIAN:  Barry Dienes. Eloise Harman, M.D.            DATE OF BIRTH:  1927/09/22   DATE OF ADMISSION:  08/20/2003  DATE OF DISCHARGE:                           DISCHARGE SUMMARY - REFERRING   HISTORY OF PRESENT ILLNESS:  The patient is a 77 year old black female with  several medical problems.  She had recently had right total hip replacement  for end-stage osteoarthritis.  Since that time, she has complained of  difficulty ambulating due to a right foot drop with progressive fatigue and  dyspnea.  She presented to the emergency room for evaluation and had a CT  scan of the chest showing extensive bilateral pulmonary emboli.  She was  admitted for further evaluation.   PAST MEDICAL HISTORY:  1. Vague history of congestive heart failure with no history of coronary     artery disease.  2. Hypertension.  3. April 2005, right total hip replacement.  4. Osteoarthritis of the left hip.  5. Right foot drop after the total hip replacement.  6. Possible history of atherosclerotic peripheral vascular disease.  7. History of tonsillectomy.   ALLERGIES:  No known drug allergies.   MEDICATIONS PRIOR TO ADMISSION:  1. Lasix 40 mg p.o. daily.  2. Labetalol 200 mg p.o. b.i.d.  3. She had stopped Coumadin for the past four weeks.   INITIAL PHYSICAL EXAMINATION:  GENERAL APPEARANCE:  She is a well-developed,  well-nourished black female who was in no apparent distress on nasal cannula  oxygen.  VITAL SIGNS:  Pulse oxygen saturation 94% on room air.  Blood pressure  128/92, pulse 81, temperature 101, respiratory rate 33, weight 186 pounds.  CHEST:  Clear to auscultation.  CARDIOVASCULAR:  Regular rate and rhythm.  ABDOMEN:  Normal bowel sounds with no hepatosplenomegaly or tenderness.  EXTREMITIES:  Left 1+ pitting edema.   LABORATORY DATA:  Chest x-ray showed left basilar atelectasis with a small  left pleural effusion.   EKG showed:  1. Normal sinus rhythm.  2. PACs.  3. Left axis deviation.  4. Left anterior fascicle block.   A CT scan of the chest showed extensive bilateral pulmonary emboli with left  basilar atelectasis and a small left pleural effusion.  There was also two  small cysts in the lower aspect of the left kidney.   Serum sodium was 139, potassium 3.5, chloride 105, CO2 23, BUN 12,  creatinine 1.0, glucose 117.  D-dimer was 12.9.  White blood cell count 6.6,  hemoglobin 9.5, hematocrit 29.1, platelets 341, MCV 71.  Urinalysis was  nitrite negative.  Troponin I was 0.51.   HOSPITAL COURSE:  The patient was admitted to a medical bed with telemetry.  She was started on high dose Lovenox with subsequent addition of Coumadin to  her regimen.  Her dyspnea rapidly improved on this regimen.  She was also  seen by a GI consultant for iron-deficiency anemia.  Given her ongoing  treatment for pulmonary embolism, he recommended a barium enema.  Although  the patient did a good prep for her barium enema, on the day of her  procedure she declined having the study done, despite the risks of a late  diagnosis of occult malignancy, for unclear reasons.  She was not with  shortness of breath or significant fatigue.  Given her right foot drop, she  was seen by a physical therapist, who recommended brief inpatient  rehabilitation, as she is unable to attend to the second flight of her home.   CONDITION ON DISCHARGE:  She is without shortness of breath with walking  short distances with a walker.  She has been eating well and has not had  nausea or rectal bleeding or any other unusual bleeding.   Most recent vital signs included a blood pressure of 127/63, pulse 64,  respiratory rate 20, temperature 97.7, pulse oxygen saturation 97% on room  air.  Most recent  laboratory tests include a white blood cell count of 4.6,  hemoglobin 8.8, hematocrit 27.5, platelets 416.  Her INR is 2.0.  Serum  sodium 137, potassium 3.8, chloride 103, CO2 26, BUN 11, creatinine 0.8.   PROCEDURES:  1. August 20, 2003, CT scan of the chest.  2. August 24, 2003, abdomen x-rays with an aborted barium enema, showing no     significant stool in the colon, right total hip replacement and     significant osteoarthritis of the left hip.   COMPLICATIONS:  None.   DISCHARGE DIAGNOSES:  1. Pulmonary embolism.  2. Status post April 2005, right total hip replacement.  3. Osteoarthritis of the left hip.  4. Right L5 neuropathy.  5. Iron-deficiency anemia.  6. Hypertension, essential, controlled.   DISCHARGE MEDICATIONS:  1. Potassium chloride 20 mEq p.o. daily.  2. Lasix 20 mg p.o. daily.  3. Lovenox 85 mg subcutaneous q.12h. on August 29, 2003, and then discontinue     this.  4. Coumadin 6 mg p.o. daily with subsequent dosing to be adjusted by the     pharmacist.  5. Protonix 40 mg p.o. daily.  6. Avapro 150 mg p.o. daily.  7. Tylenol 650 mg p.o. q.6h. p.r.n. mild pain.  8. Vicodin 5/500 one tablet p.o. q.6h. p.r.n. moderate pain.  9. Senna S two tablets p.o. q.h.s. p.r.n. constipation.  10.      Labetalol 200 mg p.o. b.i.d.   ACTIVITY:  She should ambulate with a walker and assistance only.   DIET:  She should have no added salt to her food.   FOLLOW UP:  She should be transferred to the subacute care unit or to a  skilled nursing facility for continued rehabilitation of her right foot drop  due to right L5 neuropathy and rehabilitation of her deconditioning  associated with her pulmonary embolism.                                                Barry Dienes Eloise Harman, M.D.    DGP/MEDQ  D:  08/29/2003  T:  08/29/2003  Job:  [id number]   cc:   Everardo All. Madilyn Fireman, M.D.  [address]  [place]  [place]  [address] Fax: [phone]   Ollen Gross, M.D.  [facility]  [address]  [address]  [place]  [place] [address]  Fax: [phone]

## 2010-07-06 NOTE — H&P (Signed)
NAME:  Joan Waters, ROSCOE                      ACCOUNT NO.:  [id number]   MEDICAL RECORD NO.:  [id number]                   PATIENT TYPE:  INP   LOCATION:  2550                                 FACILITY:  [facility]   PHYSICIAN:  Myrtie Neither, M.D.                 DATE OF BIRTH:  11/20/1927   DATE OF ADMISSION:  06/16/2003  DATE OF DISCHARGE:                                HISTORY & PHYSICAL   CHIEF COMPLAINT:  Painful right hip.   HISTORY OF PRESENT ILLNESS:  This is a 77 year old female I have been  following in the office for degenerative joint disease of both hips, the  right being worse than the left. The patient's, over the past few months,  overall function has diminished drastically with persisting pain, on  activity as well as at bedrest and difficulty getting up from a sitting  position and use of cane.   PAST MEDICAL HISTORY:  1. Peripheral vascular disease.  2. Tonsillectomy.  3. Degenerative arthritis.   ALLERGIES:  None known.   MEDICATIONS:  Pletal, hydrocodone and Labetalol 200 mg b.i.d.   REVIEW OF SYSTEMS:  Basically is that of the right hip. No cardiac,  respiratory, no urinary or bowel problems. The patient does have a history  of high blood pressure.   SOCIAL HISTORY:  The patient has no history of alcohol, cigarettes or  illegal drugs. The patient has a daughter.   FAMILY HISTORY:  Noncontributory.   PHYSICAL EXAMINATION:  VITAL SIGNS:  Temperature 97.3, pulse 84,  respirations 18, blood pressure 192/99, height 62.5, weight 189.  HEENT:  Normocephalic, atraumatic, sclerae clear.  NECK:  Supple.  CHEST:  Clear.  CARDIAC:  S1, S2, regular.  EXTREMITIES:  The patient has antalgic gait with limp on the right side.  Right hip limited on rotation with pain on rotation, as well as attempt at  flexion. Neurovascular status is intact.   IMPRESSION:  Severe degenerative joint disease, right hip.   PLAN:  Right total hip arthroplasty.                              Myrtie Neither, M.D.    AC/MEDQ  D:  06/16/2003  T:  06/16/2003  Job:  [id number]

## 2010-07-06 NOTE — Discharge Summary (Signed)
NAME:  Joan Waters, Joan Waters                      ACCOUNT NO.:  [id number]   MEDICAL RECORD NO.:  [id number]                   PATIENT TYPE:  ORB   LOCATION:  4503                                 FACILITY:  [facility]   PHYSICIAN:  Erick Colace, M.D.           DATE OF BIRTH:  11-27-1927   DATE OF ADMISSION:  08/30/2003  DATE OF DISCHARGE:  09/09/2003                                 DISCHARGE SUMMARY   DISCHARGE DIAGNOSES:  1. Bilateral pulmonary embolism.  2. L5 neuropathy with sciatic nerve injury.  3. Iron deficiency anemia.  4. Peripheral edema.  5. Hypertension.   HISTORY OF PRESENT ILLNESS:  Joan Waters is a 77 year old female with a  history of hypertension, right total hip replacement April 2005, with  sciatic nerve injury, and persistent edema, admitted on August 20, 2003 with  shortness of breath secondary to extensive bilateral PEs, also noted to be  anemic with H&H at 8.8 and 27.5.  A barium enema recommended but the patient  declined to have complete procedure done.  The patient was started on subcu  Lovenox and addition to Coumadin.  INR 2.1 today.  She continues with right  lower extremity weakness and lower extremity edema.  SACU is consulted for  progressive therapies as the patient currently min assist for transfers to  supervision for ambulating greater than 200 feet.   PAST MEDICAL HISTORY:  1. Hypertension.  2. Right total hip replacement, April 2005, with sciatic nerve injury.  3. Tonsillectomy.  4. L5 neuropathy.  5. Iron deficiency anemia.   ALLERGIES:  No known drug allergies.   SOCIAL HISTORY:  The patient lives alone in two-level home with five steps  at entry.  She stays on the first level.  She does not use any tobacco or  alcohol.   HOSPITAL COURSE:  Joan Waters was admitted to the [facility] on August 30, 2003, for SACU level therapy to consist of PT OT daily.  During this  admission, the patient was maintained on Coumadin throughout her  stay.  PT  INR, at the time of discharge, are therapeutic at 22.9 and 2.7.  The patient  was discharged on 5 mg alternating with 2.5 every other day.  The patient's  peripheral edema was noted to be at 1+ to 2+, the right lower extremity  greater than the left.  Compression stockings were ordered for edema control  and this continued.  Her Lasix was increased to 80 mg, her home dose.  Labs  done at admission showed her hemoglobin to be mildly improved at 9.8 with  hematocrit of 30.6, white count 4.2, platelets 476.  Check of lytes showed  sodium 140, potassium 3.7, chloride 105, CO2 26, BUN 16, creatinine 0.9,  glucose 129.  Stool guaiacs x 3 were done and have been negative.  The  patient's mood has been stable.  She has progressed along well in her  therapies.  Currently she  is at supervision level for transfers, supervision  for ambulating greater than 200 feet with a rolling-walker.  She is modified  independent for ADLs and toileting.  She will continue to receive followup  home health PT OT by [facility] past discharge.  Home health RN to  draw the next pro time, on September 12, 2003, with the results to Dr. Eloise Harman.  On September 09, 2003, the patient is discharged to home.   DISCHARGE MEDICATIONS:  1. Normodyne 200 mg b.i.d.  2. Protonix 40 mg a day.  3. Nu-Iron 150 mg a day.  4. K-Dur 20 mEq a day.  5. Avapro 150 mg a day.  6. Lasix 40 mg a day.  7. Coumadin 2.5 mg alternating with 5 mg every other day.   ACTIVITY:  Varied for right foot.   WOUND CARE:  Wear support stockings when out of bed.   SPECIAL INSTRUCTIONS:  1. No alcohol, no smoking, no driving.  2. No aspirin or aspirin-containing products while on Coumadin.   FOLLOWUP:  1. The patient is to follow up with Dr. Eloise Harman in 2-3 weeks.  2. Follow up with Dr. Myrtie Neither for recheck from hip surgery.      Greg Cutter, P.A.                    Erick Colace, M.D.    PP/MEDQ  D:  09/09/2003  T:   09/11/2003  Job:  [id number]   cc:   Barry Dienes. Eloise Harman, M.D.  [address]  [place]  [place] [address]  Fax: [phone]   Myrtie Neither, M.D.  [address] [place]  [place] [address]  Fax: [phone]

## 2010-08-13 ENCOUNTER — Encounter (HOSPITAL_COMMUNITY): Payer: Medicare Other

## 2010-08-13 ENCOUNTER — Other Ambulatory Visit: Payer: Self-pay | Admitting: Orthopedic Surgery

## 2010-08-13 LAB — DIFFERENTIAL
Basophils Relative: 1 % (ref 0–1)
Basophils Relative: 1 % (ref 0–1)
Eosinophils Absolute: 0.2 10*3/uL (ref 0.0–0.7)
Lymphocytes Relative: 30 % (ref 12–46)
Lymphs Abs: 1.5 10*3/uL (ref 0.7–4.0)
Monocytes Absolute: 0.5 10*3/uL (ref 0.1–1.0)
Monocytes Absolute: 0.5 10*3/uL (ref 0.1–1.0)
Monocytes Relative: 10 % (ref 3–12)
Monocytes Relative: 10 % (ref 3–12)
Neutro Abs: 2.6 10*3/uL (ref 1.7–7.7)
Neutro Abs: 2.6 10*3/uL (ref 1.7–7.7)
Neutrophils Relative %: 55 % (ref 43–77)
Neutrophils Relative %: 55 % (ref 43–77)

## 2010-08-13 LAB — CBC
HCT: 34.5 % — ABNORMAL LOW (ref 36.0–46.0)
Hemoglobin: 11.2 g/dL — ABNORMAL LOW (ref 12.0–15.0)
MCH: 23 pg — ABNORMAL LOW (ref 26.0–34.0)
MCHC: 32.5 g/dL (ref 30.0–36.0)
RBC: 4.88 MIL/uL (ref 3.87–5.11)

## 2010-08-13 LAB — URINE MICROSCOPIC-ADD ON

## 2010-08-13 LAB — URINALYSIS, ROUTINE W REFLEX MICROSCOPIC
Bilirubin Urine: NEGATIVE
Glucose, UA: NEGATIVE mg/dL
Hgb urine dipstick: NEGATIVE
Specific Gravity, Urine: 1.015 (ref 1.005–1.030)

## 2010-08-13 LAB — BASIC METABOLIC PANEL
BUN: 18 mg/dL (ref 6–23)
Calcium: 10.1 mg/dL (ref 8.4–10.5)
Creatinine, Ser: 0.75 mg/dL (ref 0.50–1.10)
GFR calc Af Amer: >60 mL/min (ref >60)
GFR calc non Af Amer: >60 mL/min (ref >60)
Glucose, Bld: 81 mg/dL (ref 70–99)

## 2010-08-13 LAB — PROTIME-INR
INR: 0.99 (ref 0.00–1.49)
Prothrombin Time: 13.3 seconds (ref 11.6–15.2)

## 2010-08-20 NOTE — H&P (Signed)
NAME:  Joan Waters, Joan Waters NO.:  [id number]  MEDICAL RECORD NO.:  [id number]  LOCATION:  1S                           FACILITY:  [facility]  PHYSICIAN:  Madlyn Frankel. Charlann Boxer, M.D.  DATE OF BIRTH:  06-28-1927  DATE OF ADMISSION:  08/03/2010 DATE OF DISCHARGE:                             HISTORY & PHYSICAL   ADMISSION DIAGNOSIS:  Left hip osteoarthritis.  HISTORY OF PRESENT ILLNESS:  This is an 77 year old lady with a history of previous total hip arthroplasty on the right hip.  After that surgery, she developed a DVT with PE and also a footdrop.  She now has osteoarthritis of her left hip that has failed conservative measures. After discussion of treatments, benefits, risks, and options, the patient is now scheduled for left total hip arthroplasty by anterior approach.  The risks and benefits of the surgery were discussed with the patient.  Questions invited and answered.  The patient will need to be on DVT prophylaxis for her history of DVT with PE from her previous total hip arthroplasty.  Note that the patient is not a candidate for tranexamic acid and will not receive that.  Preop also note the patient plans on going to [facility] postoperatively.  Her doctor is Dr. Jarome Matin.  PAST MEDICAL HISTORY:  Drug allergies none.  CURRENT MEDICATIONS:  Labetalol, triamterene/hydrochlorothiazide, potassium, iron, lovastatin, and vitamins.  The patient did not bring the dosages today.  She will bring those to the hospital.  MEDICAL ILLNESSES:  Hypertension, hypercholesterolemia, and history of DVT with PE.  Also history of asthma but taking no medications currently.  PREVIOUS SURGERIES:  Right total hip arthroplasty.  FAMILY HISTORY:  Positive for stroke and asthma.  SOCIAL HISTORY:  The patient is married.  She is retired.  She does not smoke and does not drink and again she is planning to go a nursing home postoperatively.  REVIEW OF SYSTEMS:  CENTRAL  NERVOUS SYSTEM:  Negative for headache, blurred vision, or dizziness. PULMONARY:  Positive for history of asthma, not currently active. CARDIOVASCULAR:  Negative for chest pain, palpitation.  Positive for hypertension and hypercholesterolemia. GI:  Negative for ulcers or hepatitis. GU:  Negative for urinary tract difficulty. MUSCULOSKELETAL:  Positive as in HPI.  PHYSICAL EXAMINATION:  VITAL SIGNS:  BP 130/80, respirations 12, pulse 68 and regular. GENERAL APPEARANCE:  This is a well-developed, well-nourished lady in no acute distress. HEENT:  Head normocephalic.  Nose patent.  Ears patent.  Pupils equal, round, and reactive.  Throat without injection. NECK:  Supple without adenopathy.  Carotids 2+ without bruit. CHEST:  Clear to auscultation.  No rales or rhonchi.  Respirations 12. HEART:  Regular rate and rhythm at 68 beats per minute without murmur. ABDOMEN:  Soft with active bowel sounds.  No masses or organomegaly. NEUROLOGIC:  The patient alert and oriented to time, place, and person. Cranial nerves II-XII grossly intact. EXTREMITIES:  The right leg status post total hip arthroplasty.  She has a footdrop on that side.  Left hip shows decreased range of motion with pain.  Neurovascular status intact.  IMPRESSION:  Left hip osteoarthritis.  PLAN:  Left total hip arthroplasty, anterior approach.  Jaquelyn Bitter. Chabon, P.A.   ______________________________ Madlyn Frankel Charlann Boxer, M.D.    SJC/MEDQ  D:  08/08/2010  T:  08/08/2010  Job:  [id number]  Electronically Signed by Jodene Nam P.A. on 08/13/2010 12:50:19 PM Electronically Signed by Durene Romans M.D. on 08/20/2010 09:28:29 AM

## 2010-08-21 ENCOUNTER — Inpatient Hospital Stay (HOSPITAL_COMMUNITY): Payer: Medicare Other

## 2010-08-21 ENCOUNTER — Inpatient Hospital Stay (HOSPITAL_COMMUNITY)
Admission: RE | Admit: 2010-08-21 | Discharge: 2010-08-28 | DRG: 470 | Disposition: A | Discharge status: Skilled Nursing Facility | Payer: Medicare Other | Source: Ambulatory Visit | Attending: Orthopedic Surgery | Admitting: Orthopedic Surgery

## 2010-08-21 DIAGNOSIS — M1610 Unilateral primary osteoarthritis, unspecified hip: Principal | ICD-10-CM | POA: Diagnosis present

## 2010-08-21 DIAGNOSIS — Z96649 Presence of unspecified artificial hip joint: Secondary | ICD-10-CM

## 2010-08-21 DIAGNOSIS — E876 Hypokalemia: Secondary | ICD-10-CM | POA: Diagnosis not present

## 2010-08-21 DIAGNOSIS — Z86711 Personal history of pulmonary embolism: Secondary | ICD-10-CM

## 2010-08-21 DIAGNOSIS — M216X9 Other acquired deformities of unspecified foot: Secondary | ICD-10-CM | POA: Diagnosis present

## 2010-08-21 DIAGNOSIS — Z01812 Encounter for preprocedural laboratory examination: Secondary | ICD-10-CM

## 2010-08-21 DIAGNOSIS — D62 Acute posthemorrhagic anemia: Secondary | ICD-10-CM | POA: Diagnosis not present

## 2010-08-21 DIAGNOSIS — I10 Essential (primary) hypertension: Secondary | ICD-10-CM | POA: Diagnosis present

## 2010-08-21 DIAGNOSIS — M169 Osteoarthritis of hip, unspecified: Principal | ICD-10-CM | POA: Diagnosis present

## 2010-08-21 DIAGNOSIS — E7800 Pure hypercholesterolemia, unspecified: Secondary | ICD-10-CM | POA: Diagnosis present

## 2010-08-21 LAB — TYPE AND SCREEN: Antibody Screen: NEGATIVE

## 2010-08-21 LAB — ABO/RH: ABO/RH(D): O POS

## 2010-08-22 LAB — CBC
MCH: 23.1 pg — ABNORMAL LOW (ref 26.0–34.0)
Platelets: 189 10*3/uL (ref 150–400)
RBC: 3.47 MIL/uL — ABNORMAL LOW (ref 3.87–5.11)
WBC: 5.4 10*3/uL (ref 4.0–10.5)

## 2010-08-22 LAB — BASIC METABOLIC PANEL
BUN: 15 mg/dL (ref 6–23)
Calcium: 8.2 mg/dL — ABNORMAL LOW (ref 8.4–10.5)
Creatinine, Ser: 0.66 mg/dL (ref 0.50–1.10)
GFR calc non Af Amer: >60 mL/min (ref >60)
Glucose, Bld: 101 mg/dL — ABNORMAL HIGH (ref 70–99)
Potassium: 3.4 mEq/L — ABNORMAL LOW (ref 3.5–5.1)

## 2010-08-23 LAB — BASIC METABOLIC PANEL
Calcium: 8.2 mg/dL — ABNORMAL LOW (ref 8.4–10.5)
GFR calc Af Amer: >60 mL/min (ref >60)
GFR calc non Af Amer: >60 mL/min (ref >60)
Glucose, Bld: 121 mg/dL — ABNORMAL HIGH (ref 70–99)
Sodium: 134 mEq/L — ABNORMAL LOW (ref 135–145)

## 2010-08-23 LAB — CBC
Hemoglobin: 8.4 g/dL — ABNORMAL LOW (ref 12.0–15.0)
MCH: 23.1 pg — ABNORMAL LOW (ref 26.0–34.0)
MCHC: 32.6 g/dL (ref 30.0–36.0)
RDW: 16 % — ABNORMAL HIGH (ref 11.5–15.5)

## 2010-08-27 NOTE — Op Note (Signed)
NAMEMarland Waters  Joan, Waters NO.:  [id number]  MEDICAL RECORD NO.:  [id number]  LOCATION:  1619                         FACILITY:  [facility]  PHYSICIAN:  Madlyn Frankel. Charlann Boxer, M.D.  DATE OF BIRTH:  Dec 21, 1927  DATE OF PROCEDURE:  08/21/2010 DATE OF DISCHARGE:                              OPERATIVE REPORT   PREOPERATIVE DIAGNOSIS:  Left hip osteoarthritis.  POSTOPERATIVE DIAGNOSIS:  Left hip osteoarthritis.  PROCEDURE:  Left total hip replacement through an anterior approach utilizing DePuy component size 52 Pinnacle cup, a size 36 +4 neutral AltrX liner, a size 1 standard Tri-Lock stem with a 36 +1.5 aSphere metal ball.  SURGEON:  Madlyn Frankel. Charlann Boxer, MD  ASSISTANT:  Surgical team.  ANESTHESIA:  Spinal.  BLOOD LOSS:  About 400 cc.  DRAINS:  One.  COMPLICATIONS:  None.  INDICATION OF THE PROCEDURE:  Joan Waters is an 77 year old female had been seen for surgical consultation of left hip osteoarthritis. Radiographs revealed end-stage degenerative changes with a previous right hip replacement.  She was lengthened on the right lower extremity with her previous hip replacement surgery.  After reviewing risks and benefits of hip replacement surgery including infection, DVT, component failure, need for revision surgery as well as the postoperative course and expectations, she wished to proceed and we discussed the posterior versus anterior approach utilizing the anterior approach to help with radiographic evaluation of leg lengths and trying to match them on the table.  Consent was obtained for benefit of pain relief through an anterior approach, total hip.  PROCEDURE IN DETAIL:  The patient was brought to operative theater. Once adequate anesthesia, preoperative antibiotics, Ancef administered, she was positioned supine on the OSI Hana table.  Once positioned and pre-prepped and pre-draped, fluoroscopy was used to confirm landmarks and evaluate the pelvis and left  femur.  At this point, the left hip was prepped and draped in sterile fashion for an anterior approach using shower curtain technique.  Time-out was performed identifying the patient, planned procedure, and extremity.  An incision was then made over the anterolateral aspect of the proximal femur lateral to the anterior-superior iliac spine.  The fascia of the tensor fascia lata muscle was identified and incised.  The muscle belly was then stripped off from the undersurface of the fascia and superior neck retractor placed, following cauterization of the circumflex vessels as well as removal of anterior capsular fat, retractors were placed inferiorly.  I then elevated the rectus off its acetabular insertion, placed retractor anteriorly.  At this point, an L-capsulotomy was made preserving the leaflets for later anatomic repair, retractors were placed intracapsularly.  At this point I, utilizing Bovie, identified and marked out the location of the neck osteotomy.  Once confirmation under fluoroscopy, the neck osteotomy was made and the femoral head was made.  The neck osteotomy was intended to be about the same length as her contralateral hip based on the radiographs available.  Femoral head was removed without difficulty or complication, noted severe end-stage degenerative changes.  Traction was taken off the femur and retractor was placed posteriorly. A second retractor placed anterior.  Labrum and pericapsular tissue and periarticular capsule removed including foveal tissue.  I began reaming  with 44 reamer and reamed up to a 51 reamer.  She had very sclerotic acetabulum and I did not fully break through this, but there were areas of punctate bleeding.  I did drill holes into the bone.  A 52 Pinnacle cup was chosen and with a curved impactor impacted the final cup under fluoroscopic imaging to make certain I was satisfied with the level of abduction.  Anatomically, a portion of the  cup was exposed posterior and superior and it felt positioned anteriorly beneath the anterior rim.  A single cancellous screw was placed in the ilium for the initial good scratch fit.  A final 36 +4 liner was impacted.  At this point, the femoral hook was placed laterally.  The hip was rolled to 80 degrees and further capsule removed off the anterior neck. I then released some of the soft tissue off the posterior aspect of the proximal trochanter.  The leg was then dropped, the femur elevated by hand and held in position with the table hook and a retractor placed over the posterior lateral trochanter.  I then inserted Box osteotome into the lateral neck.  I then used a starting broach and broached by hand for the most part and tapped in the final component.  I then looked radiographs in AP and lateral planes to confirm the orientation of the component.  Once I was satisfied with this, I began broaching with a 0 broach and then a 1 broach.  The 1 broach felt very tight within the metaphysis of the femur, placed a standard neck and did a trial reduction.  With the trial reduction, the trial component appeared to be well fit within the proximal femur, was oriented in both planes. Radiographs of pelvis indicated that her lesser trochanter now was symmetric to the contralateral hip.  This was the goal of the procedure in addition to pain relief.  At this point the trial components were removed, final canal irrigation was done.  A #1 standard Tri-Lock stem was chosen and impacted and sat at the level where the broach was. Based on this and the trial reduction, chose a 36 +1.5 aSphere metal ball.  This was impacted on the clean and dry trunnion and the hip reduced.  The hip was irrigated throughout the case and again at this point.  I reapproximated the anterior capsule to the superior leaflet.  I then placed a medium Hemovac drain underneath the tensor fascia muscle.  The tensor fascia lata  muscle fascia was then reapproximated using #1 Vicryl.  The remainder of the wound was closed with 2-0 Vicryl and running 4-0 Monocryl.  Hip was cleaned, dried and dressed sterilely using Dermabond and Aquacel dressing.  The drain site was dressed separately.  She was then brought to recovery room in stable condition tolerating the procedure well.     Madlyn Frankel Charlann Boxer, M.D.     MDO/MEDQ  D:  08/21/2010  T:  08/21/2010  Job:  [id number]  Electronically Signed by Durene Romans M.D. on 08/27/2010 09:16:00 AM

## 2010-08-27 NOTE — Discharge Summary (Signed)
  NAMEMarland Kitchen  GENESIS, NOVOSAD NO.:  [id number]  MEDICAL RECORD NO.:  [id number]  LOCATION:  1619                         FACILITY:  [facility]  PHYSICIAN:  Madlyn Frankel. Charlann Boxer, M.D.  DATE OF BIRTH:  [birth year]  DATE OF ADMISSION:  08/21/2010 DATE OF DISCHARGE:  08/27/2010                        DISCHARGE SUMMARY - REFERRING   HISTORY:  Ms. Joan Waters is very pleasant 77 year old female admitted on August 21, 2010, for a left total hip replacement which was performed through an anterior approach.  She was tentatively planned for discharge on Friday, August 24, 2010.  Unfortunately related to bed availability at the skilled nursing facility, she was unable to be discharged.  She remained in the hospital over the weekend with no medical concerns or events.  No labs were done.  She was seen and evaluated by myself on postop day #6 on August 27, 2010, and was found to be doing very well with no major complaints.  Her left hip wound was clean, dry, intact with minimal thigh swelling.  No signs of infection or drainage.  DISCHARGE CONDITION:  Stable.  DISCHARGE INSTRUCTIONS:  Her discharge instructions are unchanged from previous dictation.  To review, she should follow up with Dr. Durene Romans at [facility] at office number [phone] in 2 weeks' time.  Her current Aquacel dressing can be removed on the 11th or 12th of August and dry gauze applied to it.  She may shower, otherwise.  Any orthopedic questions can be addressed through our office.  Her medications are unchanged.     Madlyn Frankel Charlann Boxer, M.D.     MDO/MEDQ  D:  08/27/2010  T:  08/27/2010  Job:  [id number]  Electronically Signed by Durene Romans M.D. on 08/27/2010 09:16:10 AM

## 2010-08-27 NOTE — Discharge Summary (Signed)
NAMEMarland Kitchen  DEMARIE, UHLIG NO.:  [id number]  MEDICAL RECORD NO.:  [id number]  LOCATION:  1619                         [facility]ACILITY[facility]  PHYSICIAN:  Madlyn Frankel. Charlann Boxer, M.D.  DATE OF BIRTH:  1927-02-26  DATE OF ADMISSION:  08/21/2010 DATE OF DISCHARGE:  08/24/2010                        DISCHARGE SUMMARY - REFERRING   ADMITTING DIAGNOSIS:  Left hip osteoarthritis.  DISCHARGE DIAGNOSES: 1. Left hip osteoarthritis status post left total hip replacement on     August 20, 2000. 2. History of right total hip replacement with postoperative leg     length discrepancy and footdrop. 3. Hypertension. 4. Chronic anemia. 5. Hypercholesterolemia. 6. History of deep vein thrombosis with pulmonary embolism and asthma.  ADMITTING HISTORY:  Ms. Dave is a very pleasant 77 year old female seen and evaluated for left hip osteoarthritis, progressive pain despite attempts at conservative measures.  She has got a history of right total hip replacement.  We discussed the risks and benefits that she has been through before and discussed doing anterior approach which would help in aiding for leg length improvement as well as preventing aggravation of sciatic nerve.  She wished to proceed in this way.  HOSPITAL COURSE:  The patient was admitted for same-day surgery on August 21, 2010.  She underwent an anterior total hip replacement.  It was uncomplicated.  After routine stay in the recovery room, she was transferred to orthopedic ward.  Though she did have a perioperative blood loss anemia, she did not require transfusion as her postoperative day #2 hematocrit had bumped up to 25.8 from a postop day 1 value of 24.6.  Her electrolytes remained stable.  No bump in her creatinine. She was producing during  On postop day #1 her Foley catheter and Hemovac drains removed.  She was seen and evaluated by Physical Therapy.  She was made to be weightbearing as tolerated.  She had plan to go to  skilled nurse facility for rehab in preoperative state and arrangements were made with social work.  By postop day #3, she was ready for discharge.  Social work arrangements are pending.  DISCHARGE CONDITION:  She is stable at time of discharge.  DISCHARGE INSTRUCTIONS:  She will be seen and evaluated by Physical Therapy at nursing facility, hopefully, [facility]. She will be weightbearing as tolerated, working on hip strengthening, gait training with use of walker, progressing with a cane.  She will return to see Dr. Durene Romans at [facility] at [phone] in 2 weeks' time for wound evaluation.  She will be on a regular diet.  She may shower.  She currently has an Aquacel dressing in place which will remain in place until August 29, 2010 at which point a dry gauze can be applied.  Her current dressing is waterproof.  DISCHARGE MEDICATIONS:  Include: 1. Colace 100 mg p.o. b.i.d. as needed for constipation while on pain     medicine. 2. Iron 325 mg b.i.d. for 21 days. 3. Robaxin 500 mg every 6 hours as needed for muscle spasm and pain. 4. Norco 7.5/325 1 to 2 tablets every 4 to 6 hours as needed for pain. 5. MiraLax 17 g p.o. b.i.d. as needed for constipation  while on pain     medicine. 6. Xarelto 10 mg daily for 10 days. 7. Amlodipine 5 mg q.a.m. 8. Labetalol 200 mg b.i.d. 9. Lovastatin 40 mg q.h.s. 10.Potassium 20 mEq q.a.m. 11.Prenatal vitamins daily. 12.Albuterol inhaler 2 inhalations four times a day as needed. 13.Triamterene/hydrochlorothiazide 37.5/25 1 tablet q.a.m.  Questions were encouraged, answered and reviewed in the preoperative and perioperative time at preparation for discharge.  Any orthopedic questions can be dressed through our office     Molli Hazard D. Charlann Boxer, M.D.     MDO/MEDQ  D:  08/24/2010  T:  08/24/2010  Job:  [id number]  Electronically Signed by Durene Romans M.D. on 08/27/2010 09:16:05 AM

## 2010-11-09 LAB — BASIC METABOLIC PANEL
BUN: 13
Calcium: 9.5
GFR calc non Af Amer: 59 — ABNORMAL LOW
Glucose, Bld: 89
Sodium: 136

## 2010-11-09 LAB — CBC
Hemoglobin: 11.5 — ABNORMAL LOW
Platelets: 308
RDW: 15.3

## 2010-11-09 LAB — DIFFERENTIAL
Basophils Absolute: 0
Lymphocytes Relative: 34
Neutro Abs: 2.5

## 2012-07-07 IMAGING — CT CT ANGIO CHEST
2 of 6 series · 18 of 36 positions shown · IV contrast (APPLIED)
Comparison: None

, *RADIOLOGY REPORT*
CLINICAL DATA: Chest pain

CT ANGIOGRAPHY CHEST WITH CONTRAST
TECHNIQUE: Multidetector CT imaging of the chest was performed
using the standard protocol during bolus administration of
intravenous contrast.  Multiplanar CT image reconstructions
including MIPs were obtained to evaluate the vascular anatomy.
Contrast:  100 ml of omni 300

[Series 6: pulm embolism 1.0 b25f thins · axial · 0.75mm/px · z∈[+764,+1056]mm · 17 of 325 slices shown]
[im 17/325  lung]
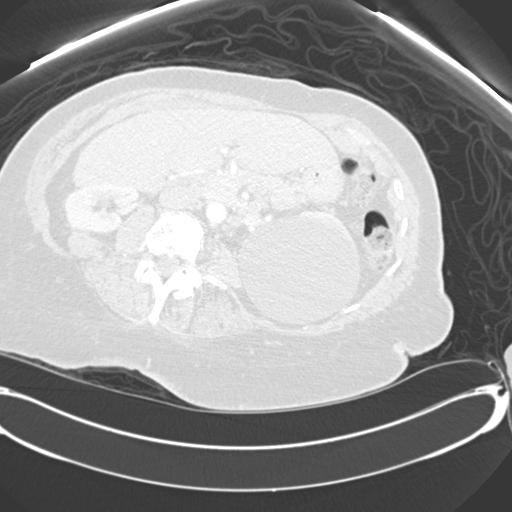
[im 33/325  mediastinal]
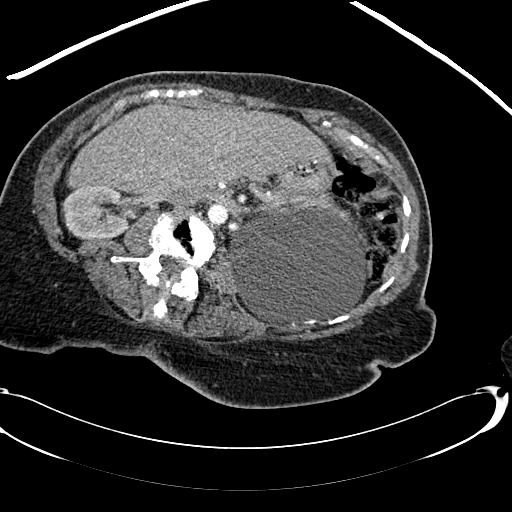
[im 49/325  lung]
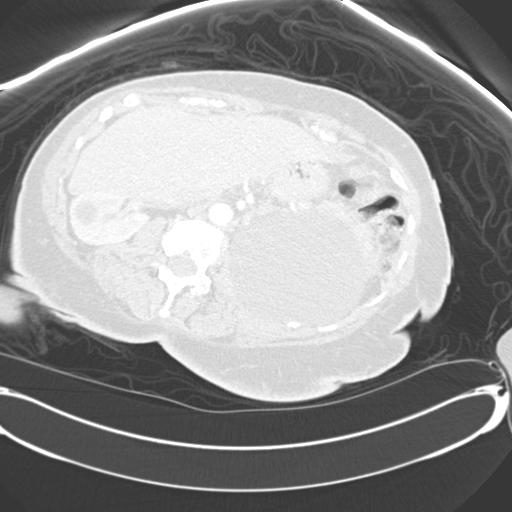
[im 65/325  mediastinal]
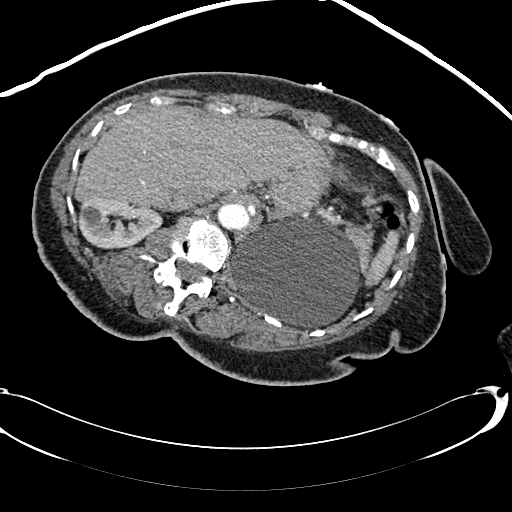
[im 98/325  lung]
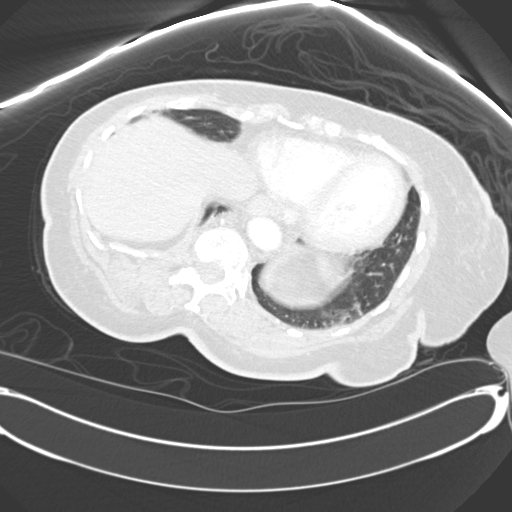
[im 114/325  mediastinal]
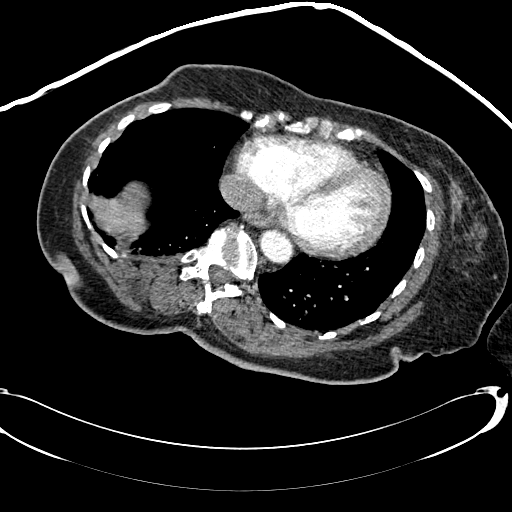
[im 130/325  lung]
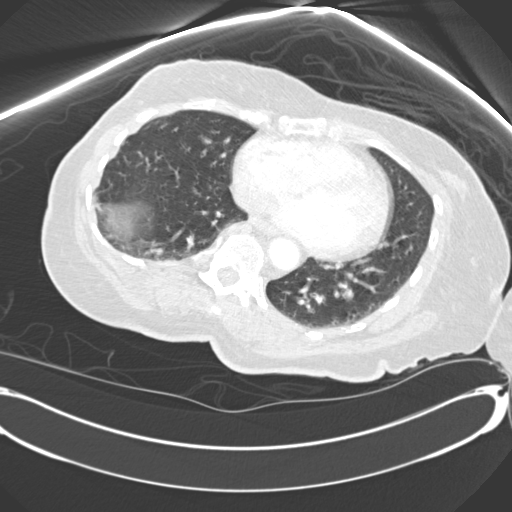
[im 146/325  mediastinal]
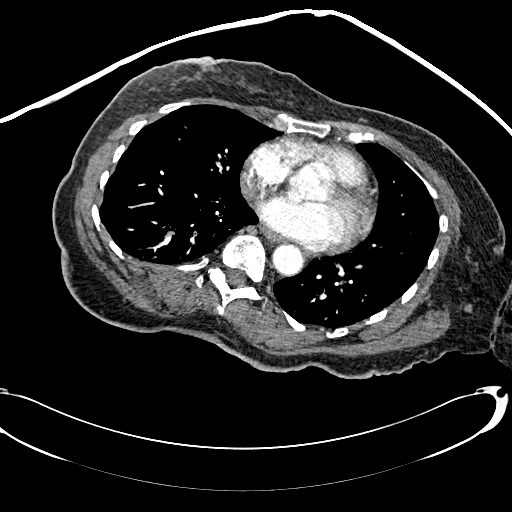
[im 163/325  lung]
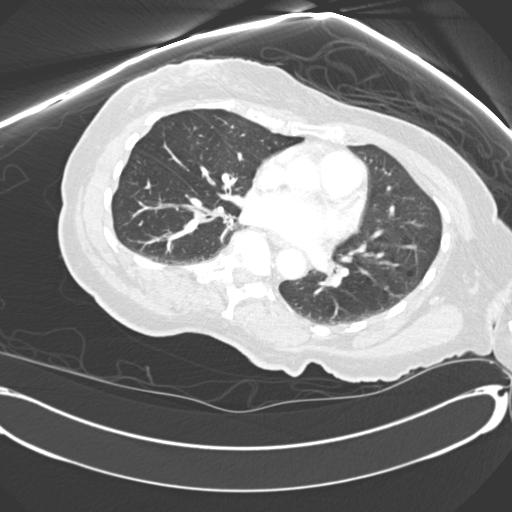
[im 179/325  mediastinal]
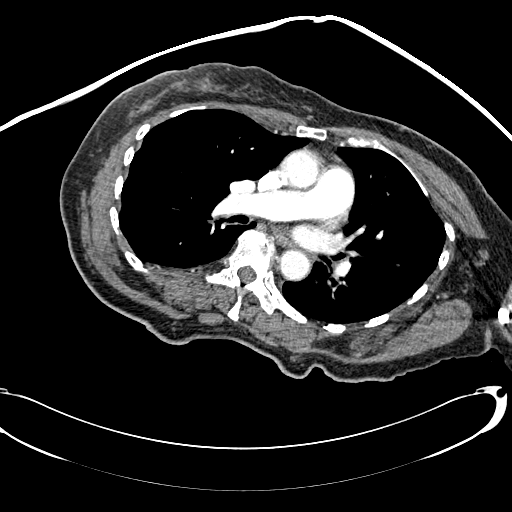
[im 195/325  lung]
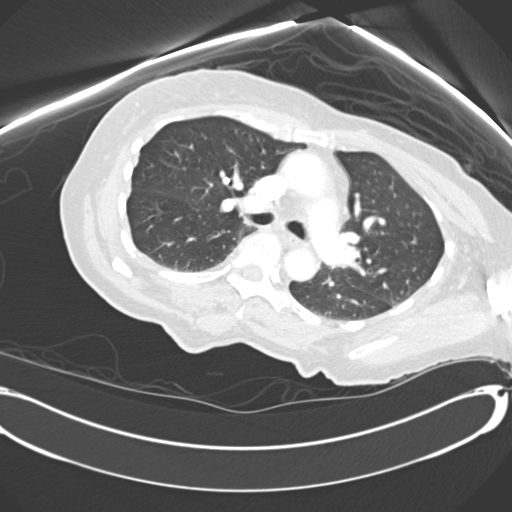
[im 211/325  mediastinal]
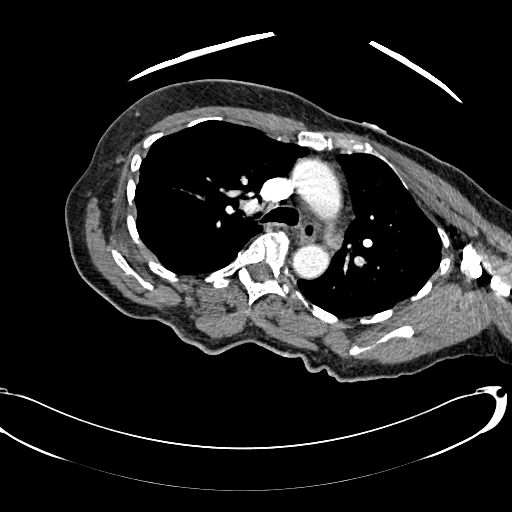
[im 227/325  lung]
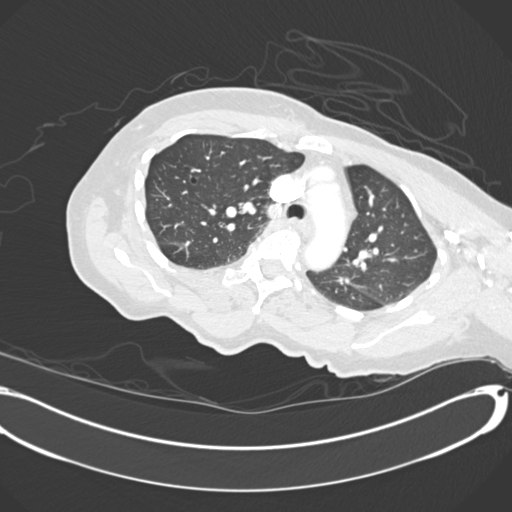
[im 260/325  mediastinal]
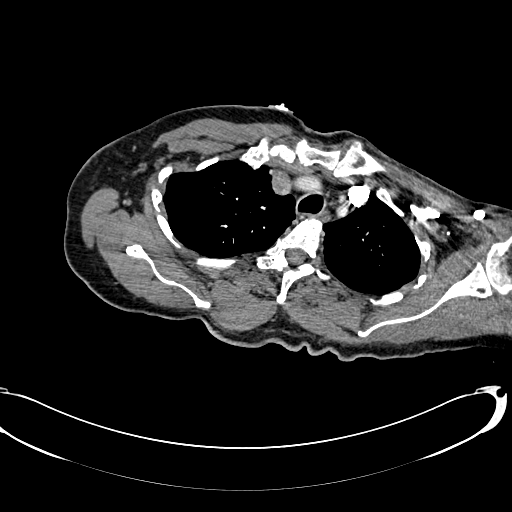
[im 276/325  lung]
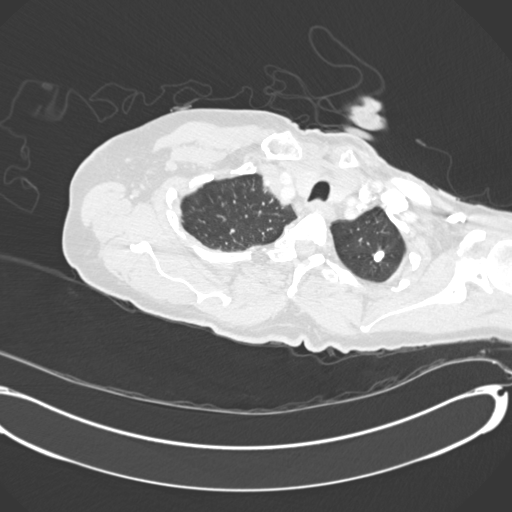
[im 292/325  mediastinal]
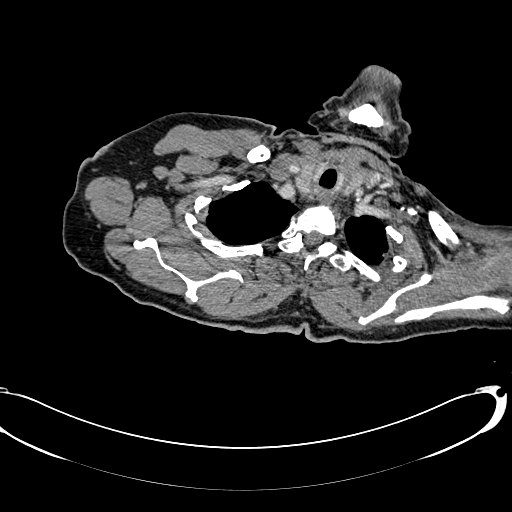
[im 308/325  lung]
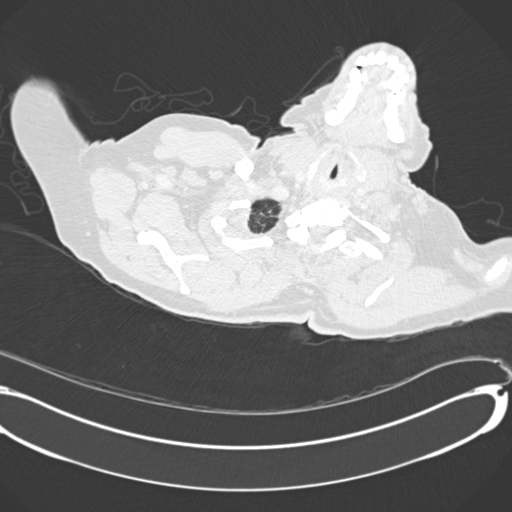

[Series 9: pulm embolism 2.0 spo lung · coronal · 0.75mm/px · 1 of 111 slices shown]
[im 56/111  mediastinal]
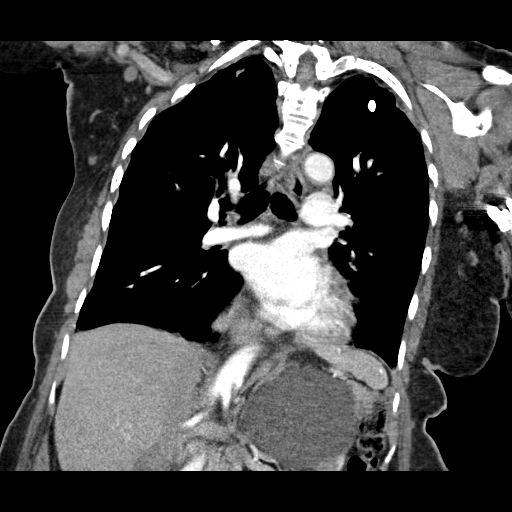

[18 of 36 positions shown; findings below may reference images not displayed]

FINDINGS: There are no abnormal filling defects identified within the main
pulmonary artery or its branches to suggest an acute pulmonary
embolus.

There is respiratory motion artifact which obscures the lower lobe
pulmonary arteries.

Large calcified granulomas noted within the left upper lobe.

There is a small nodule in the right middle lobe measuring 4.6 mm.

Right lower lobe pulmonary artery measures 5.4 mm, image number 56.

No airspace consolidation identified.

There is a focal area of bronchial occlusion within the right lower
lobe, image number 57.  Likely representing a mucous plug.

Trachea appears patent and is midline.

There is a scoliosis deformity affecting the thoracic spine.
Multilevel spondylosis is identified.

Limited imaging through the upper abdomen shows a large cyst
arising from the upper pole of the left kidney.  This is only
partially visualized but measures at least 9.4 cm.  Small cyst is
seen within the upper pole the right kidney.

Review of the MIP images confirms the above findings.
IMPRESSION: 1.  Respiratory motion artifact results in diminished detail of the
lower lobe pulmonary arteries and pulmonary parenchyma.  No acute
pulmonary embolus is identified.
2.  Noncalcified pulmonary nodules within the right middle lobe and
right lower lobe. If the patient is at high risk for bronchogenic
carcinoma, follow-up chest CT at 6-12 months is recommended.  If
the patient is at low risk for bronchogenic carcinoma, follow-up
chest CT at 12 months is recommended.  This recommendation follows
the consensus statement: Guidelines for Management of Small
Pulmonary Nodules Detected on CT Scans: A Statement from the
Online at:  [URL]

3.  Calcified nodules consistent with prior granulomatous disease
4.  Occluded right lower lobe bronchus, likely due to mucous plug.

## 2012-10-04 IMAGING — CR DG HIP 1V PORT*L*
1 series · 1 of 1 positions shown · non-contrast
Comparison: Pelvic examination of same date.

CLINICAL DATA: Postoperative status.

PORTABLE LEFT HIP - 1 VIEW

[AP]
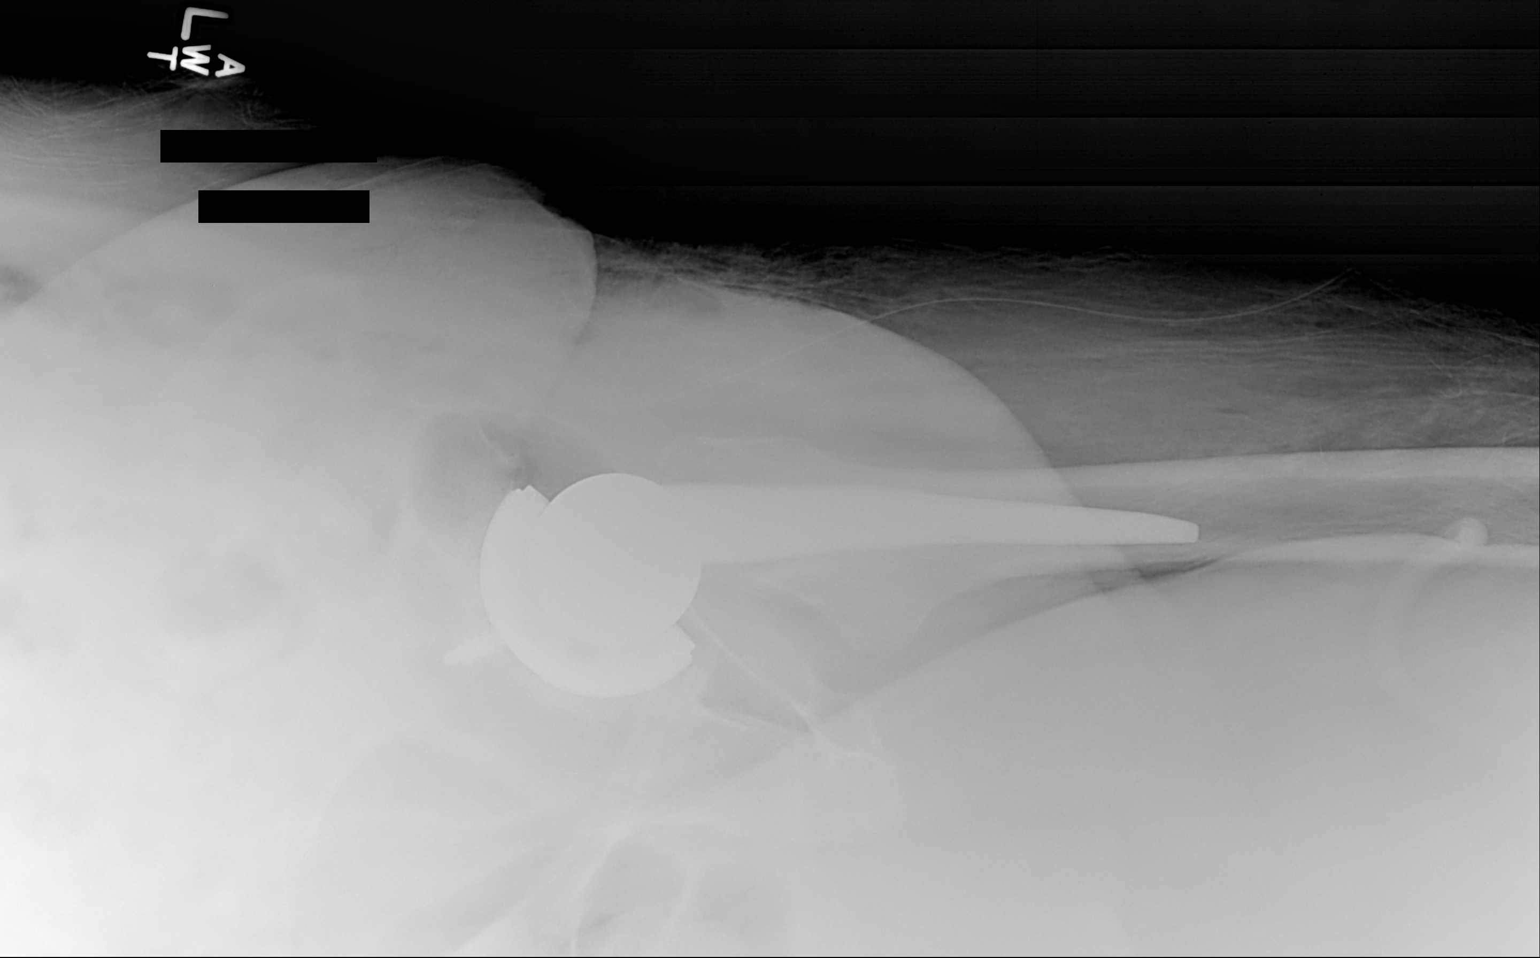

[1 of 1 positions shown; findings below may reference images not displayed]

FINDINGS: Left total hip arthroplasty has been performed.  There is
expected relationship between the femoral and acetabular components
with no dislocation or disruption of hardware.
IMPRESSION: Left total hip arthroplasty has been performed.  No dislocation or
disruption of hardware is seen.

## 2012-10-04 IMAGING — CR DG PORTABLE PELVIS
1 series · 1 of 1 positions shown · non-contrast
Comparison: None.

CLINICAL DATA: History of left hip surgery.

PORTABLE PELVIS

[AP]
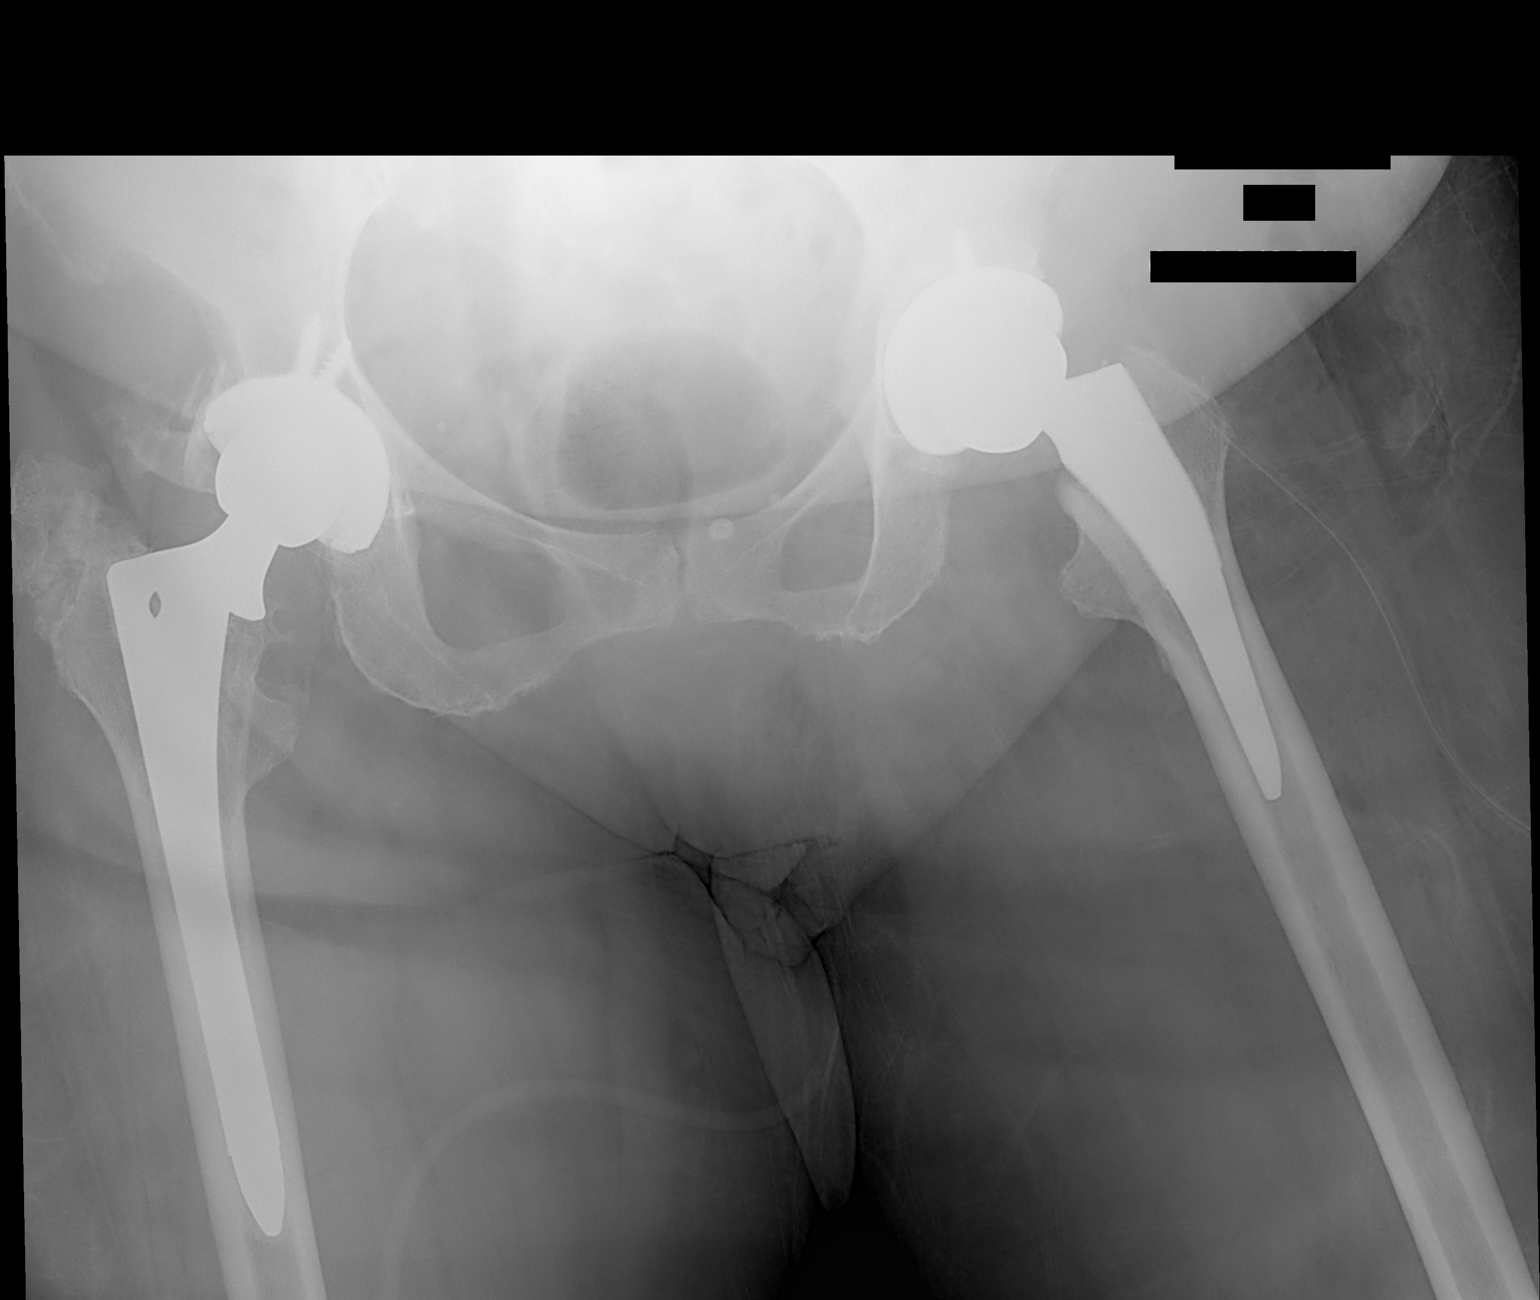

[1 of 1 positions shown; findings below may reference images not displayed]

FINDINGS: On the right side previous total hip arthroplasty
procedures been performed. Left hip surgery been performed.  Total
hip arthroplasty has been performed on the left.  There is expected
relationship between acetabular femoral components.  Surgical drain
is in place.  Pelvic phlebolith is seen.
IMPRESSION: Previous right total hip arthroplasty has been performed.  Postop
left total hip arthroplasty with surgical drain in place.  No
dislocation or disruption of hardware is seen.

## 2012-12-09 ENCOUNTER — Encounter: Payer: Self-pay | Admitting: Podiatrist

## 2012-12-09 ENCOUNTER — Ambulatory Visit (INDEPENDENT_AMBULATORY_CARE_PROVIDER_SITE_OTHER): Payer: Medicare Other | Admitting: Podiatrist

## 2012-12-09 VITALS — Ht 65.0 in | Wt 182.0 lb

## 2012-12-09 DIAGNOSIS — B351 Tinea unguium: Secondary | ICD-10-CM

## 2012-12-09 DIAGNOSIS — M79609 Pain in unspecified limb: Secondary | ICD-10-CM

## 2012-12-09 NOTE — Progress Notes (Deleted)
°  Subjective:    Patient ID: Joan Waters, female    DOB: Feb 18, 1928, 79 y.o.   MRN: [id number]  HPI trim nails    Review of Systems  Constitutional: Positive for fatigue.  HENT: Negative.   Eyes: Negative.   Respiratory: Negative.   Cardiovascular: Negative.   Gastrointestinal: Negative.   Endocrine: Negative.   Genitourinary: Negative.   Musculoskeletal: Negative.   Skin: Negative.   Allergic/Immunologic: Negative.   Neurological: Negative.   Hematological: Bruises/bleeds easily.  Psychiatric/Behavioral: Negative.        Objective:   Physical Exam        Assessment & Plan:

## 2012-12-09 NOTE — Patient Instructions (Signed)
Call if any ?'s or concerns 

## 2012-12-15 NOTE — Progress Notes (Signed)
HPI:  Patient presents today for follow up of foot and nail care. Denies any new complaints today.  Objective:  Patients chart is reviewed.  Neurovascular status unchanged.  Patients nails are thickened, discolored, distrophic, friable and brittle with yellow-brown discoloration. Patient subjectively relates they are painful with shoes and with ambulation.both feet  Assessment:  Symptomatic onychomycosis  Plan:  Discussed treatment options and alternatives.  The symptomatic toenails were debrided through manual an mechanical means without complication.  Return appointment recommended at routine intervals of 3 months   

## 2013-01-14 ENCOUNTER — Encounter (HOSPITAL_COMMUNITY): Payer: Self-pay | Admitting: Emergency Medicine

## 2013-01-14 ENCOUNTER — Emergency Department (HOSPITAL_COMMUNITY)
Admission: EM | Admit: 2013-01-14 | Discharge: 2013-01-14 | Disposition: A | Discharge status: Home / Self Care | Payer: Medicare Other | Attending: Emergency Medicine | Admitting: Emergency Medicine

## 2013-01-14 DIAGNOSIS — Z8669 Personal history of other diseases of the nervous system and sense organs: Secondary | ICD-10-CM | POA: Insufficient documentation

## 2013-01-14 DIAGNOSIS — E785 Hyperlipidemia, unspecified: Secondary | ICD-10-CM | POA: Insufficient documentation

## 2013-01-14 DIAGNOSIS — Z8739 Personal history of other diseases of the musculoskeletal system and connective tissue: Secondary | ICD-10-CM | POA: Insufficient documentation

## 2013-01-14 DIAGNOSIS — D509 Iron deficiency anemia, unspecified: Secondary | ICD-10-CM | POA: Insufficient documentation

## 2013-01-14 DIAGNOSIS — R0602 Shortness of breath: Secondary | ICD-10-CM

## 2013-01-14 DIAGNOSIS — Z86711 Personal history of pulmonary embolism: Secondary | ICD-10-CM | POA: Insufficient documentation

## 2013-01-14 DIAGNOSIS — R112 Nausea with vomiting, unspecified: Secondary | ICD-10-CM | POA: Diagnosis not present

## 2013-01-14 DIAGNOSIS — J45909 Unspecified asthma, uncomplicated: Secondary | ICD-10-CM | POA: Insufficient documentation

## 2013-01-14 DIAGNOSIS — Y9389 Activity, other specified: Secondary | ICD-10-CM | POA: Insufficient documentation

## 2013-01-14 DIAGNOSIS — Y929 Unspecified place or not applicable: Secondary | ICD-10-CM | POA: Insufficient documentation

## 2013-01-14 DIAGNOSIS — I10 Essential (primary) hypertension: Secondary | ICD-10-CM | POA: Insufficient documentation

## 2013-01-14 DIAGNOSIS — Z79899 Other long term (current) drug therapy: Secondary | ICD-10-CM | POA: Insufficient documentation

## 2013-01-14 DIAGNOSIS — T628X1A Toxic effect of other specified noxious substances eaten as food, accidental (unintentional), initial encounter: Secondary | ICD-10-CM | POA: Insufficient documentation

## 2013-01-14 MED ORDER — DIPHENHYDRAMINE HCL 50 MG/ML IJ SOLN
25.0000 mg | Freq: Once | INTRAMUSCULAR | Status: AC
  Administered 2013-01-14: 25 mg via INTRAVENOUS
  Filled 2013-01-14: qty 1

## 2013-01-14 NOTE — ED Provider Notes (Signed)
CSN: [id number]     Arrival date & time 01/14/13  1246 History   First MD Initiated Contact with Patient 01/14/13 1249     Chief Complaint  Patient presents with  . Shortness of Breath  . Allergic Reaction   (Consider location/radiation/quality/duration/timing/severity/associated sxs/prior Treatment) Patient is a 79 y.o. female presenting with shortness of breath and allergic reaction. The history is provided by the patient. No language interpreter was used.  Shortness of Breath Severity:  Mild Duration:  3 hours Context: not activity   Associated symptoms: vomiting   Associated symptoms: no abdominal pain, no chest pain, no fever, no rash and no wheezing   Allergic Reaction Presenting symptoms: no difficulty breathing, no difficulty swallowing, no itching, no rash, no swelling and no wheezing   Pt is a well-appearing 79 year old female who presents with a history of shortness of breath and one episode of vomiting this morning after eating some cereal with silk milk. She reports that this is the first time that she has ever had silk milk. She reports that she was nauseous and vomited about 45 minutes after eating this morning. She also experienced some shortness of breath that completely resolved after [facility] gave her an albuterol neb treatment. She denies any swelling of her mouth, throat or difficulty swallowing. She has not experienced any hive or itching. No other food allergies that she is aware of. She reports that she feel fine now.   Past Medical History  Diagnosis Date  . Hypertension   . OA (osteoarthritis)   . Pulmonary embolism 2005  . Atherosclerotic peripheral vascular disease   . Sciatic neuropathy     right sided causing foot drop  . Hyperlipidemia   . Iron deficiency anemia   . Allergy   . Asthma   . Foot drop     Right foot  . Blood transfusion    Past Surgical History  Procedure Laterality Date  . Total hip arthroplasty  2005    Right  .  Tonsillectomy    . Breast biopsy  2009  . Cataracts      both eyes   Family History  Problem Relation Age of Onset  . Stroke Father   . COPD Mother   . Colon cancer Maternal Grandfather    History  Substance Use Topics  . Smoking status: Never Smoker   . Smokeless tobacco: Never Used  . Alcohol Use: No   OB History   Grav Para Term Preterm Abortions TAB SAB Ect Mult Living                 Review of Systems  Constitutional: Negative for fever and chills.  HENT: Negative for trouble swallowing.   Respiratory: Positive for shortness of breath. Negative for chest tightness, wheezing and stridor.   Cardiovascular: Negative for chest pain.  Gastrointestinal: Positive for nausea and vomiting. Negative for abdominal pain and diarrhea.  Genitourinary: Negative for dysuria.  Skin: Negative for itching and rash.  Neurological: Negative for dizziness and weakness.  All other systems reviewed and are negative.    Allergies  Review of patient's allergies indicates no known allergies.  Home Medications   Current Outpatient Rx  Name  Route  Sig  Dispense  Refill  . albuterol (PROVENTIL HFA) 108 (90 BASE) MCG/ACT inhaler   Inhalation   Inhale 2 puffs into the lungs 4 (four) times daily as needed.           Marland Kitchen amLODipine (NORVASC) 5  MG tablet   Oral   Take 5 mg by mouth daily.           . ferrous sulfate 325 (65 FE) MG tablet   Oral   Take 325 mg by mouth daily with breakfast.           . labetalol (NORMODYNE) 200 MG tablet   Oral   Take 200 mg by mouth 2 (two) times daily.           Marland Kitchen lovastatin (MEVACOR) 40 MG tablet   Oral   Take 40 mg by mouth at bedtime.           . potassium chloride SA (K-DUR,KLOR-CON) 20 MEQ tablet   Oral   Take 20 mEq by mouth daily.           . Prenatal w/o A Vit-Fe Fum-FA (PRENATAL-U) 106.5-1 MG CAPS      1 tablet Daily.         Marland Kitchen triamterene-hydrochlorothiazide (MAXZIDE-25) 37.5-25 MG per tablet   Oral   Take 1 tablet by  mouth daily.            BP 137/70  Pulse 95  Temp(Src) 97.8 F (36.6 C) (Oral)  Resp 18  SpO2 97% Physical Exam  Nursing note and vitals reviewed. Constitutional: She is oriented to person, place, and time. She appears well-developed and well-nourished. No distress.  HENT:  Head: Normocephalic and atraumatic.  Eyes: Conjunctivae and EOM are normal. Pupils are equal, round, and reactive to light.  Neck: Normal range of motion. Neck supple.  Cardiovascular: Normal rate, regular rhythm, normal heart sounds and intact distal pulses.   Pulmonary/Chest: Effort normal and breath sounds normal.  Abdominal: Soft. Bowel sounds are normal. She exhibits no distension.  Musculoskeletal: Normal range of motion.  Neurological: She is alert and oriented to person, place, and time.  Skin: Skin is warm.  Psychiatric: She has a normal mood and affect. Her behavior is normal. Judgment and thought content normal.    ED Course  Procedures (including critical care time) Labs Review Labs Reviewed - No data to display Imaging Review No results found.  EKG Interpretation   None       MDM   1. Shortness of breath    Resolved shortness of breath after albuterol neb treatment x 1. Possible allergic reaction to silk milk this morning. One dose of IV benadryl here, no wheezing, difficulty breathing or difficulty swallowing. Nausea and vomiting resolved.  VS stable for discharge and she reports that she is feeling fine to go home. Advised not to drink soy products anymore. Follow-up with PCP or return here as needed.    Irish Elders, NP 01/14/13 1400

## 2013-01-14 NOTE — ED Notes (Addendum)
Pt arrives to the ED via [facility] for eval of possible allergic rxn. Pt ate some cereal this am with silk milk for the first time. Approx 45 minutes later pt began experiencing N/V and SOB. Expiratory wheezing noted throughout PTA. EMS gave 5 mg of Albuterol PTA. Pt reports a decrease in SOB following the albuterol tx. Pt slightly tachypnic at this time. No hemaemesis of coffee ground emesis. No rash or hives noted. VSS en route. Pt denies any pain at this time. Pt A&O x4 and skin warm and dry. Pt NSR on monitor.

## 2013-01-14 NOTE — ED Provider Notes (Signed)
Medical screening examination/treatment/procedure(s) were conducted as a shared visit with non-physician practitioner(s) and myself.  I personally evaluated the patient during the encounter.  Patient complained of wheezing and vomiting after drinking soymilk. No history of allergy. Treated with albuterol, symptoms resolved. Observed in ER with no further symptoms.   EKG Interpretation   None         Gilda Crease, MD 01/14/13 [phone]

## 2013-02-11 ENCOUNTER — Encounter (HOSPITAL_COMMUNITY): Payer: Self-pay | Admitting: Emergency Medicine

## 2013-02-11 ENCOUNTER — Emergency Department (HOSPITAL_COMMUNITY): Payer: Medicare Other

## 2013-02-11 ENCOUNTER — Emergency Department (HOSPITAL_COMMUNITY)
Admission: EM | Admit: 2013-02-11 | Discharge: 2013-02-12 | Disposition: A | Discharge status: Home / Self Care | Payer: Medicare Other | Attending: Emergency Medicine | Admitting: Emergency Medicine

## 2013-02-11 DIAGNOSIS — I739 Peripheral vascular disease, unspecified: Secondary | ICD-10-CM | POA: Insufficient documentation

## 2013-02-11 DIAGNOSIS — Z79899 Other long term (current) drug therapy: Secondary | ICD-10-CM | POA: Insufficient documentation

## 2013-02-11 DIAGNOSIS — I10 Essential (primary) hypertension: Secondary | ICD-10-CM | POA: Insufficient documentation

## 2013-02-11 DIAGNOSIS — J069 Acute upper respiratory infection, unspecified: Secondary | ICD-10-CM | POA: Insufficient documentation

## 2013-02-11 DIAGNOSIS — D509 Iron deficiency anemia, unspecified: Secondary | ICD-10-CM | POA: Insufficient documentation

## 2013-02-11 DIAGNOSIS — J45909 Unspecified asthma, uncomplicated: Secondary | ICD-10-CM | POA: Insufficient documentation

## 2013-02-11 DIAGNOSIS — Z86711 Personal history of pulmonary embolism: Secondary | ICD-10-CM | POA: Insufficient documentation

## 2013-02-11 DIAGNOSIS — M1990 Unspecified osteoarthritis, unspecified site: Secondary | ICD-10-CM | POA: Insufficient documentation

## 2013-02-11 DIAGNOSIS — E785 Hyperlipidemia, unspecified: Secondary | ICD-10-CM | POA: Insufficient documentation

## 2013-02-11 LAB — BASIC METABOLIC PANEL
BUN: 18 mg/dL (ref 6–23)
CO2: 30 mEq/L (ref 19–32)
Chloride: 100 mEq/L (ref 96–112)
Creatinine, Ser: 1.02 mg/dL (ref 0.50–1.10)

## 2013-02-11 LAB — CBC WITH DIFFERENTIAL/PLATELET
Basophils Absolute: 0 10*3/uL (ref 0.0–0.1)
HCT: 35.3 % — ABNORMAL LOW (ref 36.0–46.0)
Hemoglobin: 11.7 g/dL — ABNORMAL LOW (ref 12.0–15.0)
Lymphocytes Relative: 28 % (ref 12–46)
Monocytes Absolute: 0.7 10*3/uL (ref 0.1–1.0)
Monocytes Relative: 14 % — ABNORMAL HIGH (ref 3–12)
Neutro Abs: 2.4 10*3/uL (ref 1.7–7.7)
WBC: 4.7 10*3/uL (ref 4.0–10.5)

## 2013-02-11 LAB — PRO B NATRIURETIC PEPTIDE: Pro B Natriuretic peptide (BNP): 95.1 pg/mL (ref 0–450)

## 2013-02-11 NOTE — ED Notes (Signed)
Per EMS pt has c/o cough and congestion in past two days. Denies pain. No meds taken. Cough nonproductive.

## 2013-02-11 NOTE — ED Provider Notes (Signed)
CSN: [id number]     Arrival date & time 02/11/13  2151 History   First MD Initiated Contact with Patient 02/11/13 2158     Chief Complaint  Patient presents with  . Cough  . Nasal Congestion    HPI Patient presents to emergency room with complaints of cough and congestion for the last 2 days.  Other family members have been ill as well recently. Patient states she has been bringing up some mucus. She has not had any fevers. She's not having any chest pain. Patient does not feel short of breath but she did feel that the increased mucus production was making it a little bit harder for her to breathe earlier. Patient has not brought up any sputum. She denies any vomiting or diarrhea. She denies any other complaints Past Medical History  Diagnosis Date  . Hypertension   . OA (osteoarthritis)   . Pulmonary embolism 2005  . Atherosclerotic peripheral vascular disease   . Sciatic neuropathy     right sided causing foot drop  . Hyperlipidemia   . Iron deficiency anemia   . Allergy   . Asthma   . Foot drop     Right foot  . Blood transfusion    Past Surgical History  Procedure Laterality Date  . Total hip arthroplasty  2005    Right  . Tonsillectomy    . Breast biopsy  2009  . Cataracts      both eyes   Family History  Problem Relation Age of Onset  . Stroke Father   . COPD Mother   . Colon cancer Maternal Grandfather    History  Substance Use Topics  . Smoking status: Never Smoker   . Smokeless tobacco: Never Used  . Alcohol Use: No   OB History   Grav Para Term Preterm Abortions TAB SAB Ect Mult Living                 Review of Systems  All other systems reviewed and are negative.    Allergies  Soy allergy  Home Medications   Current Outpatient Rx  Name  Route  Sig  Dispense  Refill  . albuterol (PROVENTIL HFA) 108 (90 BASE) MCG/ACT inhaler   Inhalation   Inhale 2 puffs into the lungs 4 (four) times daily as needed for wheezing or shortness of breath.           . Cyanocobalamin (VITAMIN B-12 PO)   Oral   Take 1 tablet by mouth daily.         . ferrous sulfate 325 (65 FE) MG tablet   Oral   Take 325 mg by mouth daily with breakfast.           . labetalol (NORMODYNE) 200 MG tablet   Oral   Take 200 mg by mouth 2 (two) times daily.           Marland Kitchen lovastatin (MEVACOR) 40 MG tablet   Oral   Take 40 mg by mouth at bedtime.          . potassium chloride SA (K-DUR,KLOR-CON) 20 MEQ tablet   Oral   Take 20 mEq by mouth daily.           Marland Kitchen triamterene-hydrochlorothiazide (MAXZIDE-25) 37.5-25 MG per tablet   Oral   Take 1 tablet by mouth daily.            BP 143/57  Pulse 83  Temp(Src) 98.2 F (36.8 C)  SpO2 99% Physical  Exam  Nursing note and vitals reviewed. Constitutional: She appears well-developed and well-nourished. No distress.  HENT:  Head: Normocephalic and atraumatic.  Right Ear: External ear normal.  Left Ear: External ear normal.  Eyes: Conjunctivae are normal. Right eye exhibits no discharge. Left eye exhibits no discharge. No scleral icterus.  Neck: Neck supple. No tracheal deviation present.  Cardiovascular: Normal rate, regular rhythm and intact distal pulses.   Pulmonary/Chest: Effort normal and breath sounds normal. No stridor. No respiratory distress. She has no wheezes. She has no rales.  Abdominal: Soft. Bowel sounds are normal. She exhibits no distension. There is no tenderness. There is no rebound and no guarding.  Musculoskeletal: She exhibits no edema and no tenderness.  Neurological: She is alert. She has normal strength. No sensory deficit. Cranial nerve deficit:  no gross defecits noted. She exhibits normal muscle tone. She displays no seizure activity. Coordination normal.  Skin: Skin is warm and dry. No rash noted.  Psychiatric: She has a normal mood and affect.    ED Course  Procedures (including critical care time) Labs Review Labs Reviewed  CBC WITH DIFFERENTIAL  BASIC METABOLIC  PANEL  PRO B NATRIURETIC PEPTIDE   Imaging Review Dg Chest 2 View  02/11/2013   CLINICAL DATA:  Cough and congestion.  EXAM: CHEST  2 VIEW  COMPARISON:  Chest radiograph performed 05/23/2010, and CTA of the chest performed 05/24/2010  FINDINGS: The lungs are well-aerated. There is no evidence of focal opacification, pleural effusion or pneumothorax. Small bilateral calcified granulomata are seen. Known right-sided pulmonary nodules are better characterized on prior CTA. Pulmonary vascularity is at the upper limits of normal.  The heart is normal in size; the mediastinal contour is within normal limits. No acute osseous abnormalities are seen.  IMPRESSION: No acute cardiopulmonary process seen.   Electronically Signed   By: Roanna Raider M.D.   On: 02/11/2013 23:11    EKG Interpretation   None       MDM  Non toxic.  Well appearing.  CXR without pna.  Overall suspect uri, cold.  Lab tests are pending.  Dr Arnoldo Morale will check on CBC , BMET , BNP    Celene Kras, MD 02/11/13 [phone]

## 2013-02-12 MED ORDER — GUAIFENESIN 200 MG PO TABS: 200.0000 mg | ORAL_TABLET | ORAL | Status: AC | PRN

## 2013-02-12 MED ORDER — BENZONATATE 100 MG PO CAPS
100.0000 mg | ORAL_CAPSULE | ORAL | Status: AC
  Administered 2013-02-12: 100 mg via ORAL
  Filled 2013-02-12: qty 1

## 2013-02-12 MED ORDER — BENZONATATE 100 MG PO CAPS: 100.0000 mg | ORAL_CAPSULE | Freq: Three times a day (TID) | ORAL | Status: AC

## 2013-02-12 NOTE — ED Provider Notes (Signed)
I took over care of this patient at change of shift, approximately 2330. She is here with URI sx.She is afebrile with normal VS. Labs notable for mild leukocytosis and CXR is wnl. She is feeling better. She wants to go home and will go home with her grandchildren who will be able to keep an eye on her. She is stable for d/c with plan for symptomatic management,.   Brandt Loosen, MD 02/12/13 [phone]

## 2013-02-12 NOTE — ED Notes (Addendum)
Md at bedside

## 2013-03-10 ENCOUNTER — Ambulatory Visit (INDEPENDENT_AMBULATORY_CARE_PROVIDER_SITE_OTHER): Payer: Medicare Other | Admitting: Podiatrist

## 2013-03-10 ENCOUNTER — Encounter: Payer: Self-pay | Admitting: Podiatrist

## 2013-03-10 VITALS — BP 144/74 | HR 80 | Resp 18

## 2013-03-10 DIAGNOSIS — B351 Tinea unguium: Secondary | ICD-10-CM

## 2013-03-10 DIAGNOSIS — M79609 Pain in unspecified limb: Secondary | ICD-10-CM

## 2013-03-10 NOTE — Progress Notes (Signed)
I am here to get my toenails cut HPI:  Patient presents today for follow up of foot and nail care. Denies any new complaints today.  Objective:  Patients chart is reviewed.  Neurovascular status unchanged.  Patients nails are thickened, discolored, distrophic, friable and brittle with yellow-brown discoloration. Patient subjectively relates they are painful with shoes and with ambulation of bilateral feet.  Assessment:  Symptomatic onychomycosis  Plan:  Discussed treatment options and alternatives.  The symptomatic toenails were debrided through manual an mechanical means without complication.  Return appointment recommended at routine intervals of 3 months    Shayden Bobier, DPM   

## 2013-06-10 ENCOUNTER — Ambulatory Visit: Payer: Medicare Other | Admitting: Podiatrist

## 2013-07-08 ENCOUNTER — Ambulatory Visit: Payer: Medicare Other | Admitting: Podiatrist

## 2013-07-15 ENCOUNTER — Ambulatory Visit: Payer: Medicare Other | Admitting: Podiatrist

## 2013-07-26 ENCOUNTER — Ambulatory Visit: Payer: Medicare Other | Admitting: Podiatrist

## 2013-07-30 ENCOUNTER — Encounter: Payer: Self-pay | Admitting: Podiatrist

## 2013-07-30 ENCOUNTER — Ambulatory Visit (INDEPENDENT_AMBULATORY_CARE_PROVIDER_SITE_OTHER): Payer: Medicare Other | Admitting: Podiatrist

## 2013-07-30 VITALS — BP 143/65 | HR 72 | Resp 18

## 2013-07-30 DIAGNOSIS — B351 Tinea unguium: Secondary | ICD-10-CM

## 2013-07-30 DIAGNOSIS — M79609 Pain in unspecified limb: Secondary | ICD-10-CM

## 2013-07-30 NOTE — Progress Notes (Signed)
I NEED MY TOENAILS TRIMMED UP  HPI:  Patient presents today for follow up of foot and nail care. Denies any new complaints today.  Objective:  Patients chart is reviewed.  Vascular status reveals pedal pulses noted at 1 out of 4 dp and pt bilateral .  Neurological sensation is Decreased to [facility]ofil[facility] bilateral.  Patients nails are thickened, discolored, distrophic, friable and brittle with yellow-brown discoloration. Patient subjectively relates they are painful with shoes and with ambulation of bilateral feet. Mild heel calluses are also present today  Assessment:  Symptomatic onychomycosis  Plan:  Discussed treatment options and alternatives.  The symptomatic toenails were debrided through manual an mechanical means without complication.  Return appointment recommended at routine intervals of 3 months    Marlowe AschoffKathryn Egerton, DPM

## 2013-09-21 ENCOUNTER — Encounter (HOSPITAL_COMMUNITY): Payer: Self-pay | Admitting: Emergency Medicine

## 2013-09-21 ENCOUNTER — Emergency Department (INDEPENDENT_AMBULATORY_CARE_PROVIDER_SITE_OTHER)
Admission: EM | Admit: 2013-09-21 | Discharge: 2013-09-21 | Disposition: A | Discharge status: Home / Self Care | Payer: Medicare Other | Source: Home / Self Care | Attending: Emergency Medicine | Admitting: Emergency Medicine

## 2013-09-21 DIAGNOSIS — M67919 Unspecified disorder of synovium and tendon, unspecified shoulder: Secondary | ICD-10-CM

## 2013-09-21 DIAGNOSIS — M7581 Other shoulder lesions, right shoulder: Secondary | ICD-10-CM

## 2013-09-21 DIAGNOSIS — M719 Bursopathy, unspecified: Secondary | ICD-10-CM

## 2013-09-21 MED ORDER — MELOXICAM 7.5 MG PO TABS: 7.5000 mg | ORAL_TABLET | Freq: Every day | ORAL | Status: AC

## 2013-09-21 NOTE — ED Provider Notes (Signed)
CSN: [id number]     Arrival date & time 09/21/13  1446 History   First MD Initiated Contact with Patient 09/21/13 1508     Chief Complaint  Patient presents with  . Shoulder Pain   (Consider location/radiation/quality/duration/timing/severity/associated sxs/prior Treatment) HPI She is here today for evaluation of right shoulder pain. She states this pain initially started several months ago. However, it only bothered her at night. Over the last 3 days, she started to have pain in the shoulder during the day as well. Cold air seems to make it worse. Initially, Aleve would help with the pain but now is not doing much. The pain is located on the superior shoulder; it does not radiate down the arm or into the neck. She denies any numbness, tingling, weakness in the right arm. She denies any injury to the shoulder.  Past Medical History  Diagnosis Date  . Hypertension   . OA (osteoarthritis)   . Pulmonary embolism 2005  . Atherosclerotic peripheral vascular disease   . Sciatic neuropathy     right sided causing foot drop  . Hyperlipidemia   . Iron deficiency anemia   . Allergy   . Asthma   . Foot drop     Right foot  . Blood transfusion    Past Surgical History  Procedure Laterality Date  . Total hip arthroplasty  2005    Right  . Tonsillectomy    . Breast biopsy  2009  . Cataracts      both eyes   Family History  Problem Relation Age of Onset  . Stroke Father   . COPD Mother   . Colon cancer Maternal Grandfather    History  Substance Use Topics  . Smoking status: Never Smoker   . Smokeless tobacco: Never Used  . Alcohol Use: No   OB History   Grav Para Term Preterm Abortions TAB SAB Ect Mult Living                 Review of Systems  Constitutional: Negative.   Respiratory: Negative.   Musculoskeletal:       Right shoulder pain  Neurological: Negative.     Allergies  Soy allergy  Home Medications   Prior to Admission medications   Medication Sig Start Date  End Date Taking? Authorizing Provider  albuterol (PROVENTIL HFA) 108 (90 BASE) MCG/ACT inhaler Inhale 2 puffs into the lungs 4 (four) times daily as needed for wheezing or shortness of breath.     Historical Provider, MD  benzonatate (TESSALON) 100 MG capsule Take 1 capsule (100 mg total) by mouth every 8 (eight) hours. 02/12/13   Brandt Loosen, MD  Cyanocobalamin (VITAMIN B-12 PO) Take 1 tablet by mouth daily.    Historical Provider, MD  ferrous sulfate 325 (65 FE) MG tablet Take 325 mg by mouth daily with breakfast.      Historical Provider, MD  guaiFENesin 200 MG tablet Take 1 tablet (200 mg total) by mouth every 4 (four) hours as needed for cough or to loosen phlegm. 02/12/13   Brandt Loosen, MD  labetalol (NORMODYNE) 200 MG tablet Take 200 mg by mouth 2 (two) times daily.      Historical Provider, MD  lovastatin (MEVACOR) 40 MG tablet Take 40 mg by mouth at bedtime.     Historical Provider, MD  meloxicam (MOBIC) 7.5 MG tablet Take 1 tablet (7.5 mg total) by mouth daily. For 1 week, then as needed. 09/21/13   Charm Rings, MD  potassium chloride SA (K-DUR,KLOR-CON) 20 MEQ tablet Take 20 mEq by mouth daily.      Historical Provider, MD  triamterene-hydrochlorothiazide (MAXZIDE-25) 37.5-25 MG per tablet Take 1 tablet by mouth daily.      Historical Provider, MD   BP 143/70  Pulse 66  Temp(Src) 98.7 F (37.1 C) (Oral)  Resp 14  SpO2 95% Physical Exam  Constitutional: She is oriented to person, place, and time. She appears well-developed and well-nourished. No distress.  Cardiovascular: Normal rate.   Pulses:      Radial pulses are 2+ on the right side, and 2+ on the left side.  Pulmonary/Chest: Effort normal.  Musculoskeletal:       Right shoulder: She exhibits tenderness (posterior to acromion). She exhibits normal range of motion, no bony tenderness, no swelling, no crepitus and no spasm.  Positive empty can test on right with weakness compared to left  Neurological: She is alert and oriented  to person, place, and time.    ED Course  Procedures (including critical care time) Labs Review Labs Reviewed - No data to display  Imaging Review No results found.   MDM   1. Rotator cuff tendonitis, right    Discussed conservative management versus steroid injection, patient would like to go with conservative management for now. Meloxicam 7.5 mg daily for one week, then as needed. Ice 2-3 times a day. Handout with rehabilitation exercises provided. Followup with primary doctor in 2 weeks for followup. Could consider steroid injection or referral to PT at that time.    Charm RingsErin J Jacie Tristan, MD 09/21/13 1540

## 2013-09-21 NOTE — ED Notes (Signed)
Pt  Reports  r  Shoulder  Pain  For   sev  Months         denys  Any  specefic  Injury         Pain  Worse  Over last  sev  Days      Pt  Sitting upright   Speaking in  Complete  sentances  And is  In no  Acute  Distress

## 2013-09-21 NOTE — Discharge Instructions (Signed)
Take Meloxicam daily for 1 week, then as needed.  Do not take ibuprofen, aleve, or advil with this medicine. You can take tylenol if you need to. Ice the shoulder 2-3 times a day. Do the exercises below at least once a day. Follow up with your primary doctor in about 2 weeks.  Impingement Syndrome, Rotator Cuff, Bursitis with Rehab Impingement syndrome is a condition that involves inflammation of the tendons of the rotator cuff and the subacromial bursa, that causes pain in the shoulder. The rotator cuff consists of four tendons and muscles that control much of the shoulder and upper arm function. The subacromial bursa is a fluid filled sac that helps reduce friction between the rotator cuff and one of the bones of the shoulder (acromion). Impingement syndrome is usually an overuse injury that causes swelling of the bursa (bursitis), swelling of the tendon (tendonitis), and/or a tear of the tendon (strain). Strains are classified into three categories. Grade 1 strains cause pain, but the tendon is not lengthened. Grade 2 strains include a lengthened ligament, due to the ligament being stretched or partially ruptured. With grade 2 strains there is still function, although the function may be decreased. Grade 3 strains include a complete tear of the tendon or muscle, and function is usually impaired. SYMPTOMS   Pain around the shoulder, often at the outer portion of the upper arm.  Pain that gets worse with shoulder function, especially when reaching overhead or lifting.  Sometimes, aching when not using the arm.  Pain that wakes you up at night.  Sometimes, tenderness, swelling, warmth, or redness over the affected area.  Loss of strength.  Limited motion of the shoulder, especially reaching behind the back (to the back pocket or to unhook bra) or across your body.  Crackling sound (crepitation) when moving the arm.  Biceps tendon pain and inflammation (in the front of the shoulder). Worse  when bending the elbow or lifting. CAUSES  Impingement syndrome is often an overuse injury, in which chronic (repetitive) motions cause the tendons or bursa to become inflamed. A strain occurs when a force is paced on the tendon or muscle that is greater than it can withstand. Common mechanisms of injury include: Stress from sudden increase in duration, frequency, or intensity of training.  Direct hit (trauma) to the shoulder.  Aging, erosion of the tendon with normal use.  Bony bump on shoulder (acromial spur). PROGNOSIS  If treated properly, impingement syndrome usually goes away within 6 weeks. Sometimes surgery is required.  RELATED COMPLICATIONS   Longer healing time if not properly treated, or if not given enough time to heal.  Recurring symptoms, that result in a chronic condition.  Shoulder stiffness, frozen shoulder, or loss of motion.  Rotator cuff tendon tear.  Recurring symptoms, especially if activity is resumed too soon, with overuse, with a direct blow, or when using poor technique. TREATMENT  Treatment first involves the use of ice and medicine, to reduce pain and inflammation. The use of strengthening and stretching exercises may help reduce pain with activity. These exercises may be performed at home or with a therapist. If non-surgical treatment is unsuccessful after more than 6 months, surgery may be advised. After surgery and rehabilitation, activity is usually possible in 3 months.  MEDICATION  If pain medicine is needed, nonsteroidal anti-inflammatory medicines (aspirin and ibuprofen), or other minor pain relievers (acetaminophen), are often advised.  Do not take pain medicine for 7 days before surgery.  Prescription pain relievers may be  given, if your caregiver thinks they are needed. Use only as directed and only as much as you need.  Corticosteroid injections may be given by your caregiver. These injections should be reserved for the most serious cases,  because they may only be given a certain number of times. HEAT AND COLD  Cold treatment (icing) should be applied for 10 to 15 minutes every 2 to 3 hours for inflammation and pain, and immediately after activity that aggravates your symptoms. Use ice packs or an ice massage.  Heat treatment may be used before performing stretching and strengthening activities prescribed by your caregiver, physical therapist, or athletic trainer. Use a heat pack or a warm water soak. SEEK MEDICAL CARE IF:   Symptoms get worse or do not improve in 4 to 6 weeks, despite treatment.  New, unexplained symptoms develop. (Drugs used in treatment may produce side effects.) EXERCISES  RANGE OF MOTION (ROM) AND STRETCHING EXERCISES - Impingement Syndrome (Rotator Cuff  Tendinitis, Bursitis) These exercises may help you when beginning to rehabilitate your injury. Your symptoms may go away with or without further involvement from your physician, physical therapist or athletic trainer. While completing these exercises, remember:   Restoring tissue flexibility helps normal motion to return to the joints. This allows healthier, less painful movement and activity.  An effective stretch should be held for at least 30 seconds.  A stretch should never be painful. You should only feel a gentle lengthening or release in the stretched tissue. STRETCH - Flexion, Standing  Stand with good posture. With an underhand grip on your right / left hand, and an overhand grip on the opposite hand, grasp a broomstick or cane so that your hands are a little more than shoulder width apart.  Keeping your right / left elbow straight and shoulder muscles relaxed, push the stick with your opposite hand, to raise your right / left arm in front of your body and then overhead. Raise your arm until you feel a stretch in your right / left shoulder, but before you have increased shoulder pain.  Try to avoid shrugging your right / left shoulder as your  arm rises, by keeping your shoulder blade tucked down and toward your mid-back spine. Hold for __________ seconds.  Slowly return to the starting position. Repeat __________ times. Complete this exercise __________ times per day. STRETCH - Abduction, Supine  Lie on your back. With an underhand grip on your right / left hand and an overhand grip on the opposite hand, grasp a broomstick or cane so that your hands are a little more than shoulder width apart.  Keeping your right / left elbow straight and your shoulder muscles relaxed, push the stick with your opposite hand, to raise your right / left arm out to the side of your body and then overhead. Raise your arm until you feel a stretch in your right / left shoulder, but before you have increased shoulder pain.  Try to avoid shrugging your right / left shoulder as your arm rises, by keeping your shoulder blade tucked down and toward your mid-back spine. Hold for __________ seconds.  Slowly return to the starting position. Repeat __________ times. Complete this exercise __________ times per day. ROM - Flexion, Active-Assisted  Lie on your back. You may bend your knees for comfort.  Grasp a broomstick or cane so your hands are about shoulder width apart. Your right / left hand should grip the end of the stick, so that your hand is positioned "  thumbs-up," as if you were about to shake hands.  Using your healthy arm to lead, raise your right / left arm overhead, until you feel a gentle stretch in your shoulder. Hold for __________ seconds.  Use the stick to assist in returning your right / left arm to its starting position. Repeat __________ times. Complete this exercise __________ times per day.  ROM - Internal Rotation, Supine   Lie on your back on a firm surface. Place your right / left elbow about 60 degrees away from your side. Elevate your elbow with a folded towel, so that the elbow and shoulder are the same height.  Using a broomstick  or cane and your strong arm, pull your right / left hand toward your body until you feel a gentle stretch, but no increase in your shoulder pain. Keep your shoulder and elbow in place throughout the exercise.  Hold for __________ seconds. Slowly return to the starting position. Repeat __________ times. Complete this exercise __________ times per day. STRETCH - Internal Rotation  Place your right / left hand behind your back, palm up.  Throw a towel or belt over your opposite shoulder. Grasp the towel with your right / left hand.  While keeping an upright posture, gently pull up on the towel, until you feel a stretch in the front of your right / left shoulder.  Avoid shrugging your right / left shoulder as your arm rises, by keeping your shoulder blade tucked down and toward your mid-back spine.  Hold for __________ seconds. Release the stretch, by lowering your healthy hand. Repeat __________ times. Complete this exercise __________ times per day. ROM - Internal Rotation   Using an underhand grip, grasp a stick behind your back with both hands.  While standing upright with good posture, slide the stick up your back until you feel a mild stretch in the front of your shoulder.  Hold for __________ seconds. Slowly return to your starting position. Repeat __________ times. Complete this exercise __________ times per day.  STRETCH - Posterior Shoulder Capsule   Stand or sit with good posture. Grasp your right / left elbow and draw it across your chest, keeping it at the same height as your shoulder.  Pull your elbow, so your upper arm comes in closer to your chest. Pull until you feel a gentle stretch in the back of your shoulder.  Hold for __________ seconds. Repeat __________ times. Complete this exercise __________ times per day. STRENGTHENING EXERCISES - Impingement Syndrome (Rotator Cuff Tendinitis, Bursitis) These exercises may help you when beginning to rehabilitate your injury.  They may resolve your symptoms with or without further involvement from your physician, physical therapist or athletic trainer. While completing these exercises, remember:  Muscles can gain both the endurance and the strength needed for everyday activities through controlled exercises.  Complete these exercises as instructed by your physician, physical therapist or athletic trainer. Increase the resistance and repetitions only as guided.  You may experience muscle soreness or fatigue, but the pain or discomfort you are trying to eliminate should never worsen during these exercises. If this pain does get worse, stop and make sure you are following the directions exactly. If the pain is still present after adjustments, discontinue the exercise until you can discuss the trouble with your clinician.  During your recovery, avoid activity or exercises which involve actions that place your injured hand or elbow above your head or behind your back or head. These positions stress the tissues which you  are trying to heal. STRENGTH - Scapular Depression and Adduction   With good posture, sit on a firm chair. Support your arms in front of you, with pillows, arm rests, or on a table top. Have your elbows in line with the sides of your body.  Gently draw your shoulder blades down and toward your mid-back spine. Gradually increase the tension, without tensing the muscles along the top of your shoulders and the back of your neck.  Hold for __________ seconds. Slowly release the tension and relax your muscles completely before starting the next repetition.  After you have practiced this exercise, remove the arm support and complete the exercise in standing as well as sitting position. Repeat __________ times. Complete this exercise __________ times per day.  STRENGTH - Shoulder Abductors, Isometric  With good posture, stand or sit about 4-6 inches from a wall, with your right / left side facing the wall.  Bend  your right / left elbow. Gently press your right / left elbow into the wall. Increase the pressure gradually, until you are pressing as hard as you can, without shrugging your shoulder or increasing any shoulder discomfort.  Hold for __________ seconds.  Release the tension slowly. Relax your shoulder muscles completely before you begin the next repetition. Repeat __________ times. Complete this exercise __________ times per day.  STRENGTH - External Rotators, Isometric  Keep your right / left elbow at your side and bend it 90 degrees.  Step into a door frame so that the outside of your right / left wrist can press against the door frame without your upper arm leaving your side.  Gently press your right / left wrist into the door frame, as if you were trying to swing the back of your hand away from your stomach. Gradually increase the tension, until you are pressing as hard as you can, without shrugging your shoulder or increasing any shoulder discomfort.  Hold for __________ seconds.  Release the tension slowly. Relax your shoulder muscles completely before you begin the next repetition. Repeat __________ times. Complete this exercise __________ times per day.  STRENGTH - Supraspinatus   Stand or sit with good posture. Grasp a __________ weight, or an exercise band or tubing, so that your hand is "thumbs-up," like you are shaking hands.  Slowly lift your right / left arm in a "V" away from your thigh, diagonally into the space between your side and straight ahead. Lift your hand to shoulder height or as far as you can, without increasing any shoulder pain. At first, many people do not lift their hands above shoulder height.  Avoid shrugging your right / left shoulder as your arm rises, by keeping your shoulder blade tucked down and toward your mid-back spine.  Hold for __________ seconds. Control the descent of your hand, as you slowly return to your starting position. Repeat __________  times. Complete this exercise __________ times per day.  STRENGTH - External Rotators  Secure a rubber exercise band or tubing to a fixed object (table, pole) so that it is at the same height as your right / left elbow when you are standing or sitting on a firm surface.  Stand or sit so that the secured exercise band is at your uninjured side.  Bend your right / left elbow 90 degrees. Place a folded towel or small pillow under your right / left arm, so that your elbow is a few inches away from your side.  Keeping the tension on the exercise  band, pull it away from your body, as if pivoting on your elbow. Be sure to keep your body steady, so that the movement is coming only from your rotating shoulder.  Hold for __________ seconds. Release the tension in a controlled manner, as you return to the starting position. Repeat __________ times. Complete this exercise __________ times per day.  STRENGTH - Internal Rotators   Secure a rubber exercise band or tubing to a fixed object (table, pole) so that it is at the same height as your right / left elbow when you are standing or sitting on a firm surface.  Stand or sit so that the secured exercise band is at your right / left side.  Bend your elbow 90 degrees. Place a folded towel or small pillow under your right / left arm so that your elbow is a few inches away from your side.  Keeping the tension on the exercise band, pull it across your body, toward your stomach. Be sure to keep your body steady, so that the movement is coming only from your rotating shoulder.  Hold for __________ seconds. Release the tension in a controlled manner, as you return to the starting position. Repeat __________ times. Complete this exercise __________ times per day.  STRENGTH - Scapular Protractors, Standing   Stand arms length away from a wall. Place your hands on the wall, keeping your elbows straight.  Begin by dropping your shoulder blades down and toward  your mid-back spine.  To strengthen your protractors, keep your shoulder blades down, but slide them forward on your rib cage. It will feel as if you are lifting the back of your rib cage away from the wall. This is a subtle motion and can be challenging to complete. Ask your caregiver for further instruction, if you are not sure you are doing the exercise correctly.  Hold for __________ seconds. Slowly return to the starting position, resting the muscles completely before starting the next repetition. Repeat __________ times. Complete this exercise __________ times per day. STRENGTH - Scapular Protractors, Supine  Lie on your back on a firm surface. Extend your right / left arm straight into the air while holding a __________ weight in your hand.  Keeping your head and back in place, lift your shoulder off the floor.  Hold for __________ seconds. Slowly return to the starting position, and allow your muscles to relax completely before starting the next repetition. Repeat __________ times. Complete this exercise __________ times per day. STRENGTH - Scapular Protractors, Quadruped  Get onto your hands and knees, with your shoulders directly over your hands (or as close as you can be, comfortably).  Keeping your elbows locked, lift the back of your rib cage up into your shoulder blades, so your mid-back rounds out. Keep your neck muscles relaxed.  Hold this position for __________ seconds. Slowly return to the starting position and allow your muscles to relax completely before starting the next repetition. Repeat __________ times. Complete this exercise __________ times per day.  STRENGTH - Scapular Retractors  Secure a rubber exercise band or tubing to a fixed object (table, pole), so that it is at the height of your shoulders when you are either standing, or sitting on a firm armless chair.  With a palm down grip, grasp an end of the band in each hand. Straighten your elbows and lift your  hands straight in front of you, at shoulder height. Step back, away from the secured end of the band, until it  becomes tense.  Squeezing your shoulder blades together, draw your elbows back toward your sides, as you bend them. Keep your upper arms lifted away from your body throughout the exercise.  Hold for __________ seconds. Slowly ease the tension on the band, as you reverse the directions and return to the starting position. Repeat __________ times. Complete this exercise __________ times per day. STRENGTH - Shoulder Extensors   Secure a rubber exercise band or tubing to a fixed object (table, pole) so that it is at the height of your shoulders when you are either standing, or sitting on a firm armless chair.  With a thumbs-up grip, grasp an end of the band in each hand. Straighten your elbows and lift your hands straight in front of you, at shoulder height. Step back, away from the secured end of the band, until it becomes tense.  Squeezing your shoulder blades together, pull your hands down to the sides of your thighs. Do not allow your hands to go behind you.  Hold for __________ seconds. Slowly ease the tension on the band, as you reverse the directions and return to the starting position. Repeat __________ times. Complete this exercise __________ times per day.  STRENGTH - Scapular Retractors and External Rotators   Secure a rubber exercise band or tubing to a fixed object (table, pole) so that it is at the height as your shoulders, when you are either standing, or sitting on a firm armless chair.  With a palm down grip, grasp an end of the band in each hand. Bend your elbows 90 degrees and lift your elbows to shoulder height, at your sides. Step back, away from the secured end of the band, until it becomes tense.  Squeezing your shoulder blades together, rotate your shoulders so that your upper arms and elbows remain stationary, but your fists travel upward to head height.  Hold  for __________ seconds. Slowly ease the tension on the band, as you reverse the directions and return to the starting position. Repeat __________ times. Complete this exercise __________ times per day.  STRENGTH - Scapular Retractors and External Rotators, Rowing   Secure a rubber exercise band or tubing to a fixed object (table, pole) so that it is at the height of your shoulders, when you are either standing, or sitting on a firm armless chair.  With a palm down grip, grasp an end of the band in each hand. Straighten your elbows and lift your hands straight in front of you, at shoulder height. Step back, away from the secured end of the band, until it becomes tense.  Step 1: Squeeze your shoulder blades together. Bending your elbows, draw your hands to your chest, as if you are rowing a boat. At the end of this motion, your hands and elbow should be at shoulder height and your elbows should be out to your sides.  Step 2: Rotate your shoulders, to raise your hands above your head. Your forearms should be vertical and your upper arms should be horizontal.  Hold for __________ seconds. Slowly ease the tension on the band, as you reverse the directions and return to the starting position. Repeat __________ times. Complete this exercise __________ times per day.  STRENGTH - Scapular Depressors  Find a sturdy chair without wheels, such as a dining room chair.  Keeping your feet on the floor, and your hands on the chair arms, lift your bottom up from the seat, and lock your elbows.  Keeping your elbows straight, allow  gravity to pull your body weight down. Your shoulders will rise toward your ears.  Raise your body against gravity by drawing your shoulder blades down your back, shortening the distance between your shoulders and ears. Although your feet should always maintain contact with the floor, your feet should progressively support less body weight, as you get stronger.  Hold for __________  seconds. In a controlled and slow manner, lower your body weight to begin the next repetition. Repeat __________ times. Complete this exercise __________ times per day.  Document Released: 02/04/2005 Document Revised: 04/29/2011 Document Reviewed: 05/19/2008 [facility]Exit[facility] Patient Information 2015 [place], [place]. This information is not intended to replace advice given to you by your health care provider. Make sure you discuss any questions you have with your health care provider.

## 2013-11-04 ENCOUNTER — Ambulatory Visit: Payer: Medicare Other | Admitting: Podiatrist

## 2013-11-18 DEATH — deceased
